# Patient Record
Sex: Female | Born: 1960
Health system: Southern US, Community
[De-identification: ages and names within clinical notes are randomized; demographics above are authoritative.]

## PROBLEM LIST (undated history)

## (undated) DIAGNOSIS — F329 Major depressive disorder, single episode, unspecified: Secondary | ICD-10-CM

## (undated) DIAGNOSIS — F17200 Nicotine dependence, unspecified, uncomplicated: Secondary | ICD-10-CM

## (undated) DIAGNOSIS — I10 Essential (primary) hypertension: Secondary | ICD-10-CM

## (undated) DIAGNOSIS — F32A Depression, unspecified: Secondary | ICD-10-CM

## (undated) HISTORY — DX: Depression, unspecified: F32.A

## (undated) HISTORY — DX: Nicotine dependence, unspecified, uncomplicated: F17.200

## (undated) HISTORY — DX: Essential (primary) hypertension: I10

## (undated) HISTORY — DX: Major depressive disorder, single episode, unspecified: F32.9

## (undated) HISTORY — PX: TUBAL LIGATION: SHX77

---

## 1998-01-21 ENCOUNTER — Other Ambulatory Visit: Admission: RE | Admit: 1998-01-21 | Discharge: 1998-01-21 | Payer: Self-pay | Admitting: Obstetrics and Gynecology

## 1998-04-05 HISTORY — PX: PELVIC LAPAROSCOPY: SHX162

## 1998-04-28 ENCOUNTER — Ambulatory Visit (HOSPITAL_COMMUNITY): Admission: RE | Admit: 1998-04-28 | Discharge: 1998-04-28 | Payer: Self-pay | Admitting: Obstetrics and Gynecology

## 1999-02-03 ENCOUNTER — Other Ambulatory Visit: Admission: RE | Admit: 1999-02-03 | Discharge: 1999-02-03 | Payer: Self-pay | Admitting: Gynecology

## 2000-09-13 ENCOUNTER — Other Ambulatory Visit: Admission: RE | Admit: 2000-09-13 | Discharge: 2000-09-13 | Payer: Self-pay | Admitting: Gynecology

## 2002-02-13 ENCOUNTER — Other Ambulatory Visit: Admission: RE | Admit: 2002-02-13 | Discharge: 2002-02-13 | Payer: Self-pay | Admitting: Gynecology

## 2003-05-01 ENCOUNTER — Other Ambulatory Visit: Admission: RE | Admit: 2003-05-01 | Discharge: 2003-05-01 | Payer: Self-pay | Admitting: Family Medicine

## 2004-06-18 ENCOUNTER — Other Ambulatory Visit: Admission: RE | Admit: 2004-06-18 | Discharge: 2004-06-18 | Payer: Self-pay | Admitting: Family Medicine

## 2005-11-18 ENCOUNTER — Ambulatory Visit: Payer: Self-pay | Admitting: Professional

## 2005-11-22 ENCOUNTER — Ambulatory Visit: Payer: Self-pay | Admitting: Professional

## 2006-04-05 HISTORY — PX: COLONOSCOPY: SHX174

## 2007-01-09 ENCOUNTER — Ambulatory Visit: Payer: Self-pay | Admitting: Gastroenterology

## 2007-01-23 ENCOUNTER — Ambulatory Visit: Payer: Self-pay | Admitting: Gastroenterology

## 2009-11-18 ENCOUNTER — Other Ambulatory Visit: Admission: RE | Admit: 2009-11-18 | Discharge: 2009-11-18 | Payer: Self-pay | Admitting: Gynecology

## 2009-11-18 ENCOUNTER — Ambulatory Visit: Payer: Self-pay | Admitting: Gynecology

## 2009-11-21 ENCOUNTER — Ambulatory Visit: Payer: Self-pay | Admitting: Gynecology

## 2010-02-24 ENCOUNTER — Encounter: Payer: Self-pay | Admitting: Family Medicine

## 2010-02-24 ENCOUNTER — Ambulatory Visit: Payer: Self-pay | Admitting: Family Medicine

## 2010-02-24 DIAGNOSIS — I10 Essential (primary) hypertension: Secondary | ICD-10-CM | POA: Insufficient documentation

## 2010-02-24 DIAGNOSIS — T7840XA Allergy, unspecified, initial encounter: Secondary | ICD-10-CM

## 2010-02-24 DIAGNOSIS — F17200 Nicotine dependence, unspecified, uncomplicated: Secondary | ICD-10-CM | POA: Insufficient documentation

## 2010-03-16 ENCOUNTER — Encounter: Payer: Self-pay | Admitting: Family Medicine

## 2010-03-16 ENCOUNTER — Ambulatory Visit: Payer: Self-pay | Admitting: Family Medicine

## 2010-03-16 LAB — CONVERTED CEMR LAB
CO2: 29 meq/L (ref 19–32)
Calcium: 8.7 mg/dL (ref 8.4–10.5)
Creatinine, Ser: 0.9 mg/dL (ref 0.4–1.2)
Glucose, Bld: 81 mg/dL (ref 70–99)
Sodium: 137 meq/L (ref 135–145)

## 2010-03-17 ENCOUNTER — Telehealth: Payer: Self-pay | Admitting: *Deleted

## 2010-05-07 NOTE — Assessment & Plan Note (Signed)
Summary: to be est/bp issues/njr ok per rachel/njr   Vital Signs:  Patient profile:   50 year old female Menstrual status:  irregular LMP:     01/04/2010 Height:      66 inches Weight:      186 pounds BMI:     30.13 Temp:     98.7 degrees F oral BP sitting:   132 / 98  (left arm) Cuff size:   regular  Vitals Entered By: Kern Reap CMA Duncan Dull) (February 24, 2010 3:29 PM) LMP (date): 01/04/2010     Menstrual Status irregular Enter LMP: 01/04/2010   History of Present Illness: Jacayla is a 50 year old, married female, who comes in today as a new patient for evaluation of hypertension.  Her GYN and Dr. Lily Peer, recently diagnosed her with hypertension, and start her on Benicar HCT 20 -- 12.5.  BP 132/98 here today which is consistent when she is getting at home.  She takes sertraline under milligrams daily for mild depression and has been on this for 4 years and wishes to continue.  She smokes 10 cigarettes a day for the past two years prior to that.  She had quit for 14 years.  We discussed the fact that we would need to stop smoking completely.  She concurs and will follow the program, which N. checks.  She's had a colonoscopy because her mother died of colon cancer.  Fortunately, her colonoscopy was normal.  Review of systems otherwise negative.  Except her last mammogram was 18 months ago advised to get yearly.  Tetanus booster August 2011 seasonal flu shot October 2011  Preventive Screening-Counseling & Management  Alcohol-Tobacco     Smoking Status: current     Packs/Day: 0.5  Caffeine-Diet-Exercise     Does Patient Exercise: yes  Hep-HIV-STD-Contraception     Dental Visit-last 6 months no      Drug Use:  no.    Allergies (verified): 1)  ! * Minocycoline  Past History:  Past medical, surgical, family and social histories (including risk factors) reviewed, and no changes noted (except as noted below).  Past Medical History: Allergic  rhinitis Hypertension  Past Surgical History: Caesarean section X 3  Family History: Reviewed history and no changes required. Father: alzheimer - deceased Mother: Parkinson's desease, hx of colon cancer Siblings: 2 brothers, 5 sisters Children: 3 - healthy  Social History: Reviewed history and no changes required. 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 No apparent alopecia or balding. Eyes:  No corneal or conjunctival inflammation noted. EOMI. Perrla. Funduscopic exam benign, without hemorrhages, exudates or papilledema. Vision grossly normal. Ears:  External ear exam shows no significant lesions or deformities.  Otoscopic examination reveals clear canals, tympanic membranes are intact bilaterally without bulging, retraction, inflammation or discharge. Hearing is grossly normal bilaterally. Nose:  External nasal examination shows no deformity or inflammation. Nasal mucosa are pink and moist without lesions or exudates. Mouth:  Oral mucosa and oropharynx without lesions or exudates.  Teeth in good repair. Neck:  No deformities, masses, or tenderness noted. Chest Wall:  No deformities, masses, or tenderness noted. Breasts:  No mass, nodules, thickening, tenderness,  bulging, retraction, inflamation, nipple discharge or skin changes noted.   Lungs:  Normal respiratory effort, chest expands symmetrically. Lungs are clear to auscultation, no crackles or wheezes. Heart:  Normal rate and regular rhythm. S1 and S2 normal without gallop, murmur, click, rub or other extra  sounds. Abdomen:  Bowel sounds positive,abdomen soft and non-tender without masses, organomegaly or hernias noted. Msk:  No deformity or scoliosis noted of thoracic or lumbar spine.   Pulses:  R and L carotid,radial,femoral,dorsalis pedis and posterior tibial pulses are full and equal bilaterally Neurologic:  No cranial nerve deficits noted. Station and gait are normal. Plantar reflexes are down-going bilaterally. DTRs are symmetrical throughout. Sensory, motor and coordinative functions appear intact. Skin:  Intact without suspicious lesions or rashes...................Marland Kitchenexcept for a birthmark on the inner side of her left posterior upper thigh Cervical Nodes:  No lymphadenopathy noted Axillary Nodes:  No palpable lymphadenopathy Inguinal Nodes:  No significant adenopathy Psych:  Cognition and judgment appear intact. Alert and cooperative with normal attention span and concentration. No apparent delusions, illusions, hallucinations   Impression & Recommendations:  Problem # 1:  HYPERTENSION (ICD-401.9) Assessment New  The following medications were removed from the medication list:    Benicar Hct 20-12.5 Mg Tabs (Olmesartan medoxomil-hctz) .Marland Kitchen... Take one tab by mouth once daily Her updated medication list for this problem includes:    Zestoretic 20-25 Mg Tabs (Lisinopril-hydrochlorothiazide) .Marland Kitchen... Take 1 tablet by mouth every morning  Orders: EKG w/ Interpretation (93000)  Complete Medication List: 1)  Sertraline Hcl 100 Mg Tabs (Sertraline hcl) .... Take one tab by mouth once daily 2)  Zestoretic 20-25 Mg Tabs (Lisinopril-hydrochlorothiazide) .... Take 1 tablet by mouth every morning 3)  Chantix Continuing Month Pak 1 Mg Tabs (Varenicline tartrate) .... Uad  Other Orders: Tobacco use cessation intermediate 3-10 minutes (16109)  Patient Instructions: 1)  discontinue the Benicar, and begin the Zestoretic one tablet daily in the morning.  Check your blood pressure daily in the morning  and return in two weeks for follow-up. 2)  Begin the chantix one half tablet daily. 3)  Walk 15 minutes daily and stay on a salt free diet 4)  Schedule your mammogram. 5)  Take calcium +Vitamin D daily. 6)  Take an Aspirin every day. Prescriptions: SERTRALINE HCL 100 MG TABS (SERTRALINE HCL) take one tab by mouth once daily  #100 x 3   Entered and Authorized by:   Roderick Pee MD   Signed by:   Roderick Pee MD on 02/24/2010   Method used:   Print then Give to Patient   RxID:   [id number] CHANTIX CONTINUING MONTH PAK 1 MG TABS (VARENICLINE TARTRATE) UAD  #1 x 2   Entered and Authorized by:   Roderick Pee MD   Signed by:   Roderick Pee MD on 02/24/2010   Method used:   Print then Give to Patient   RxID:   [id number] ZESTORETIC 20-25 MG TABS (LISINOPRIL-HYDROCHLOROTHIAZIDE) Take 1 tablet by mouth every morning  #30 x 1   Entered and Authorized by:   Roderick Pee MD   Signed by:   Roderick Pee MD on 02/24/2010   Method used:   Print then Give to Patient   RxID:   [id number]    Orders Added: 1)  EKG w/ Interpretation [93000] 2)  New Patient 40-64 years [99386] 3)  Tobacco use cessation intermediate 3-10 minutes [99406]

## 2010-05-07 NOTE — Progress Notes (Signed)
  Phone Note Outgoing Call   Call placed by: Kathlene November LPN,  March 17, 2010 2:01 PM Call placed to: Patient Summary of Call: [facility] for pt to return call for results and instructions. Initial call taken by: Kathlene November LPN,  March 17, 2010 2:01 PM  Follow-up for Phone Call        Pt notified of resultts and MD instructions Follow-up by: Kathlene November LPN,  March 17, 2010 3:23 PM    Additional Follow-up for Phone Call Additional follow up Details #2::    please call.......... labs normal except potassium slightly low at 3.4,,,,,,,,,,, range of normal 3.5 to 5.5,,,,,,,,, take one over-the-counter potassium supplement daily Follow-up by: Roderick Pee MD,  March 17, 2010 1:33 PM

## 2010-05-07 NOTE — Assessment & Plan Note (Signed)
Summary: 2 week fup//ccm/pt rsc/cjr   Vital Signs:  Patient profile:   50 year old female Menstrual status:  irregular Height:      66 inches Weight:      180.5 pounds Temp:     97.9 degrees F oral Pulse rate:   85 / minute BP sitting:   136 / 86  (left arm) Cuff size:   regular  Vitals Entered By: Kathlene November LPN (March 16, 2010 3:21 PM) CC: recheck BP   CC:  recheck BP.  History of Present Illness: Alexandria Cruz is a 50 year old female, who comes in today for a general physical examination because of a history of underlying tobacco abuse, hypertension, sleep dysfunction, and depression.  For hypertension.  She takes Zestoretic 20 to 25 daily.  BP 130/86.  She takes 100 mg of Zoloft at bedtime, along with a Ambien for sleep.  She is also on chantix a half a tablet a day and has cut her cigarette consumption down to 10 a day.  We discussed various options to go to zero.  Paps are done by GYN.  She has routine eye care, dental care, B SE monthly, annual mammography, colonoscopy at age 62, because her mother had colon cancer.  Tetanus 2,011 seasonal flu shot 2011    She used routine eye care, dental care, BSE monthly, annual mammography, colonoscopy at age 75, normal because her mother had colon cancer.  Tetanus 52,011 seasonal flu shot 2011 Current Medications (verified): 1)  Sertraline Hcl 100 Mg Tabs (Sertraline Hcl) .... Take One Tab By Mouth Once Daily 2)  Zestoretic 20-25 Mg Tabs (Lisinopril-Hydrochlorothiazide) .... Take 1 Tablet By Mouth Every Morning 3)  Chantix Continuing Month Pak 1 Mg Tabs (Varenicline Tartrate) .... Uad 4)  Ambien 10 Mg Tabs (Zolpidem Tartrate) .... Take One Tablet By Mouth At Bedtime For Sleep  Allergies (verified): 1)  ! * Minocycoline  Comments:  Nurse/Medical Assistant: The patient's medications and allergies were reviewed with the patient and were updated in the Medication and Allergy Lists. Kathlene November LPN (March 16, 2010 3:22  PM)  Past History:  Past medical, surgical, family and social histories (including risk factors) reviewed, and no changes noted (except as noted below).  Past Medical History: Reviewed history from 02/24/2010 and no changes required. Allergic rhinitis Hypertension  Past Surgical History: Reviewed history from 02/24/2010 and no changes required. Caesarean section X 3  Family History: Reviewed history from 02/24/2010 and no changes required. Father: alzheimer - deceased Mother: Parkinson's desease, hx of colon cancer Siblings: 2 brothers, 5 sisters Children: 3 - healthy  Social History: Reviewed history from 02/24/2010 and no changes required. 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 No apparent alopecia or balding. Eyes:  No corneal or conjunctival inflammation noted. EOMI. Perrla. Funduscopic exam benign, without hemorrhages, exudates or papilledema. Vision grossly normal. Ears:  External ear exam shows no significant lesions or deformities.  Otoscopic examination reveals clear canals, tympanic membranes are intact bilaterally without bulging, retraction, inflammation or discharge. Hearing is grossly normal bilaterally. Nose:  External nasal examination shows no deformity or inflammation. Nasal mucosa are pink and moist without lesions or exudates. Mouth:  Oral mucosa and oropharynx without lesions or exudates.  Teeth in good repair. Neck:  No deformities, masses, or  tenderness noted. Chest Wall:  No deformities, masses, or tenderness noted. Breasts:  No mass, nodules, thickening, tenderness, bulging, retraction, inflamation, nipple discharge or skin changes noted.   Lungs:   Normal respiratory effort, chest expands symmetrically. Lungs are clear to auscultation, no crackles or wheezes. Heart:  Normal rate and regular rhythm. S1 and S2 normal without gallop, murmur, click, rub or other extra sounds. Abdomen:  Bowel sounds positive,abdomen soft and non-tender without masses, organomegaly or hernias noted. Msk:  No deformity or scoliosis noted of thoracic or lumbar spine.   Pulses:  R and L carotid,radial,femoral,dorsalis pedis and posterior tibial pulses are full and equal bilaterally Extremities:  No clubbing, cyanosis, edema, or deformity noted with normal full range of motion of all joints.   Neurologic:  No cranial nerve deficits noted. Station and gait are normal. Plantar reflexes are down-going bilaterally. DTRs are symmetrical throughout. Sensory, motor and coordinative functions appear intact. Skin:  Intact without suspicious lesions or rashes Cervical Nodes:  No lymphadenopathy noted Axillary Nodes:  No palpable lymphadenopathy Inguinal Nodes:  No significant adenopathy Psych:  Cognition and judgment appear intact. Alert and cooperative with normal attention span and concentration. No apparent delusions, illusions, hallucinations   Impression & Recommendations:  Problem # 1:  TOBACCO ABUSE (ICD-305.1) Assessment Improved  Her updated medication list for this problem includes:    Chantix Continuing Month Pak 1 Mg Tabs (Varenicline tartrate) ..... Uad  Orders: Venipuncture (16109) TLB-BMP (Basic Metabolic Panel-BMET) (80048-METABOL) Prescription Created Electronically [id number]) Specimen Handling (09811)  Problem # 2:  ROUTINE GENERAL MEDICAL [email or web address]  CARE FACL (ICD-V70.0) Assessment: Unchanged  Orders: Venipuncture (91478) TLB-BMP (Basic Metabolic Panel-BMET) (80048-METABOL) Prescription Created Electronically [id number]) Specimen Handling (13086)  Problem # 3:  HYPERTENSION (ICD-401.9) Assessment: Improved  Her updated medication list for this  problem includes:    Zestoretic 20-25 Mg Tabs (Lisinopril-hydrochlorothiazide) .Marland Kitchen... Take 1 tablet by mouth every morning  Orders: Venipuncture (57846) TLB-BMP (Basic Metabolic Panel-BMET) (80048-METABOL) Prescription Created Electronically [id number]) Specimen Handling (28413)  Complete Medication List: 1)  Sertraline Hcl 100 Mg Tabs (Sertraline hcl) .... Take one tab by mouth once daily 2)  Zestoretic 20-25 Mg Tabs (Lisinopril-hydrochlorothiazide) .... Take 1 tablet by mouth every morning 3)  Chantix Continuing Month Pak 1 Mg Tabs (Varenicline tartrate) .... Uad 4)  Ambien 10 Mg Tabs (Zolpidem tartrate) .... Take one tablet by mouth at bedtime for sleep  Patient Instructions: 1)  begin to taper these cigarettes by two per week and set a quit date. 2)  Tried to increase the chantix to one half tab twice daily to see if this might help more than a half a tablet a day or just stay with a half a tablet a day. 3)  Please schedule a follow-up appointment in 1 year. 4)  It is important that you exercise regularly at least 20 minutes 5 times a week. If you develop chest pain, have severe difficulty breathing, or feel very tired , stop exercising immediately and seek medical attention. 5)  Schedule your mammogram. 6)  Schedule a colonoscopy/sigmoidoscopy to help detect colon cancer. 7)  Take calcium +Vitamin D daily. 8)  Take an Aspirin every day. Prescriptions: AMBIEN 10 MG TABS (ZOLPIDEM TARTRATE) Take one tablet by mouth at bedtime for sleep  #100 x 3   Entered and Authorized by:   Roderick Pee MD   Signed by:   Roderick Pee MD on 03/16/2010   Method used:   Print then Give to Patient   RxID:  [id number] ZESTORETIC 20-25 MG TABS (LISINOPRIL-HYDROCHLOROTHIAZIDE) Take 1 tablet by mouth every morning  #100 x 3   Entered and Authorized by:   Roderick Pee MD   Signed by:   Roderick Pee MD on 03/16/2010   Method used:   Print then Give to Patient   RxID:    [id number]    Orders Added: 1)  Venipuncture [25366] 2)  TLB-BMP (Basic Metabolic Panel-BMET) [80048-METABOL] 3)  Prescription Created Electronically [[id number]] 4)  Est. Patient 40-64 years [99396] 5)  Specimen Handling [99000]   Immunization History:  Tetanus/Td Immunization History:    Tetanus/Td:  historical (11/03/2009)  Influenza Immunization History:    Influenza:  historical (01/03/2010)   Immunization History:  Tetanus/Td Immunization History:    Tetanus/Td:  Historical (11/03/2009)  Influenza Immunization History:    Influenza:  Historical (01/03/2010)

## 2010-07-06 ENCOUNTER — Other Ambulatory Visit: Payer: Self-pay | Admitting: *Deleted

## 2010-07-06 MED ORDER — ZOLPIDEM TARTRATE 10 MG PO TABS: 10.0000 mg | ORAL_TABLET | Freq: Every evening | ORAL | Status: DC | PRN

## 2010-12-01 ENCOUNTER — Other Ambulatory Visit: Payer: Self-pay | Admitting: *Deleted

## 2010-12-01 MED ORDER — ZOLPIDEM TARTRATE 10 MG PO TABS: 10.0000 mg | ORAL_TABLET | Freq: Every evening | ORAL | Status: DC | PRN

## 2011-02-04 ENCOUNTER — Other Ambulatory Visit (HOSPITAL_COMMUNITY)
Admission: RE | Admit: 2011-02-04 | Discharge: 2011-02-04 | Disposition: A | Discharge status: Home / Self Care | Payer: BC Managed Care – PPO | Source: Ambulatory Visit | Attending: Gynecology | Admitting: Gynecology

## 2011-02-04 ENCOUNTER — Encounter: Payer: Self-pay | Admitting: Gynecology

## 2011-02-04 ENCOUNTER — Ambulatory Visit (INDEPENDENT_AMBULATORY_CARE_PROVIDER_SITE_OTHER): Payer: BC Managed Care – PPO | Admitting: Gynecology

## 2011-02-04 DIAGNOSIS — N951 Menopausal and female climacteric states: Secondary | ICD-10-CM | POA: Insufficient documentation

## 2011-02-04 DIAGNOSIS — Z01419 Encounter for gynecological examination (general) (routine) without abnormal findings: Secondary | ICD-10-CM | POA: Insufficient documentation

## 2011-02-04 DIAGNOSIS — I10 Essential (primary) hypertension: Secondary | ICD-10-CM

## 2011-02-04 DIAGNOSIS — R635 Abnormal weight gain: Secondary | ICD-10-CM

## 2011-02-04 DIAGNOSIS — Z1211 Encounter for screening for malignant neoplasm of colon: Secondary | ICD-10-CM

## 2011-02-04 LAB — COMPREHENSIVE METABOLIC PANEL
AST: 20 U/L (ref 0–37)
Albumin: 4.2 g/dL (ref 3.5–5.2)
Alkaline Phosphatase: 99 U/L (ref 39–117)
Calcium: 9.4 mg/dL (ref 8.4–10.5)
Chloride: 109 mEq/L (ref 96–112)
Potassium: 4.6 mEq/L (ref 3.5–5.3)
Sodium: 143 mEq/L (ref 135–145)
Total Protein: 6.4 g/dL (ref 6.0–8.3)

## 2011-02-04 NOTE — Progress Notes (Signed)
Alexandria Cruz 03-18-1961 [id number]   History:    50 y.o.  for annual exam complaining of vasomotor symptoms such as hot flashes irritability and mood swing vaginal dryness and decreased libido. She stated her last menstrual period was in June of this year. She is being followed by her internist for her hypertension and has not taken her blood pressure medicine today. Review of her records indicated she was weighing 184 last year and 180 now. Her blood pressure today was 156/94. Patient is smoking half a pack cigarette per day and her primary physician's been working with her and encouraged her to take chantix. Review records indicated her last mammogram was in January 2012. She is doing her monthly self breast examination. Her mother has had history of colon cancer the patient herself had a colonoscopy 5 years ago. She's also had a previous tubal ligation procedure.  Past medical history,surgical history, family history and social history were all reviewed and documented in the EPIC chart.  ROS:  Was performed and pertinent positives and negatives are included in the history.  Exam: chaperone present BP 156/94  Ht 5' 6.5" (1.689 m)  Wt 180 lb (81.647 kg)  BMI 28.62 kg/m2  LMP 10/28/2010  Body mass index is 28.62 kg/(m^2).  General appearance : Well developed well nourished female. No acute distress HEENT: Neck supple, trachea midline, no carotid bruits, no thyroidmegaly Lungs: Clear to auscultation, no rhonchi or wheezes, or rib retractions  Heart: Regular rate and rhythm, no murmurs or gallops Breast:Examined in sitting and supine position were symmetrical in appearance, no palpable masses or tenderness,  no skin retraction, no nipple inversion, no nipple discharge, no skin discoloration, no axillary or supraclavicular lymphadenopathy Abdomen: no palpable masses or tenderness, no rebound or guarding Extremities: no edema or skin discoloration or tenderness  Pelvic:  Bartholin,  Urethra, Skene Glands: Within normal limits             Vagina: No gross lesions or discharge  Cervix: No gross lesions or discharge  Uterus  anteverted, normal size, shape and consistency, non-tender and mobile  Adnexa  Without masses or tenderness  Anus and perineum  normal   Rectovaginal  normal sphincter tone without palpated masses or tenderness             Hemoccult done resulting in time of this dictation     Assessment/Plan:  50 y.o. female for annual exam with signs of systems consisting of the menopause. We'll confirm with an [facility] today and literature formation on the menopause as well as on hormone replacement therapy will be provided and patient will return back next week to plan a course of management and discuss her lab results. Since she is hypertensive and has not seen her internist and posterior year will proceed with doing her comprehensive metabolic panel along with her CBC fasting lipid profile urinalysis and Pap smear. She was encouraged to contact her gastroenterologist to schedule colonoscopy. For fecal occult blood testing was done today resulting in time of this dictation. She was encouraged to continue monthly self breast examination. And we discussed importance of calcium and vitamin D for osteoporosis prevention as well. Next year will do a baseline bone density study as well.    Ok Edwards MD, 10:27 AM 02/04/2011

## 2011-02-04 NOTE — Patient Instructions (Signed)
Patient information: Menopause (Beyond the Basics)  Author Cherie Ouch, MD Section Editors Lysbeth Galas, MD Marcelle Smiling, MD Deputy Editor Mila Merry, MD Disclosures  All topics are updated as new evidence becomes available and our peer review process is complete.  Literature review current through: Sep 2012.  This topic last updated: Nov 20, 2010.  INTRODUCTION - Menopause is defined as the time in a woman's life, usually between 45 and 55 years, when the ovaries stop producing eggs and menstrual periods end. The average age of menopause is 51 years. Years before you stop having menstrual periods, changes in your hormone levels can lead to some of the symptoms of menopause. In addition to irregular periods, the most common symptoms are hot flashes, night sweats, sleep problems, and vaginal dryness. Menopause is a normal part of a woman's life and does not always need to be treated. However, the changes that happen before and after menopause can be disruptive. If you have bothersome symptoms, effective treatments are available. More detailed information about menopause is available by subscription. (See "Clinical manifestations and diagnosis of menopause".) AM I GOING THROUGH MENOPAUSE? - A number of terms are used to describe the time before and after you stop having menstrual periods. The menopausal transition starts when your menstrual periods first begin to change (become more or less frequent, more or less bleeding, skipped periods), and ends when you have your final menstrual period. Many people refer to this transition as "perimenopause."  Menopause occurs when it has been 12 months since your last menstrual period.  Postmenopause is the time after menopause. The average age of menopause is 58 years, although the age range can vary between 51 and 55 years. Women who become menopausal before age 69 are considered to have an abnormally early menopause (called  premature ovarian failure or primary ovarian insufficiency). (See "Patient information: Early menopause (primary ovarian insufficiency) (Beyond the Basics)".) If you are 45 years or older and you have not had a menstrual period in 12 months, there is a good chance that you are menopausal. Most women in this group do not need any lab testing to confirm menopause, especially if they are having menopausal symptoms such as hot flashes or vaginal dryness. If you are less than 82 years old and you stop having periods or if you have questions about menopausal symptoms, talk to your doctor or nurse. You may need further testing to see if menopause, or another problem, is the cause of your symptoms. After hysterectomy - If you do not have a uterus (eg, after hysterectomy) but you still have ovaries, it can be hard to know when you are menopausal because you will not have menstrual periods. You may develop menopausal symptoms as your ovaries stop working and your blood levels of estrogen begin to fall. If you are having bothersome symptoms of menopause after hysterectomy, talk to your doctor or nurse. MENOPAUSE AND MENSTRUAL PERIODS - Many women begin to notice changes in their menstrual periods during the menopausal transition (perimenopause). These changes may include: Having menstrual periods more or less often than usual (eg, every five to six weeks instead of every four)  Having bleeding that lasts for fewer days than before  Skipping one or more menstrual periods  Having symptoms of menopause (see 'Menopause symptoms' below) Abnormal bleeding - It can be hard to know if vaginal bleeding is abnormal when you are near menopause. In general, you should see your doctor or nurse if  you have the following symptoms: Vaginal bleeding more often than every three weeks  Excessive, heavy menstrual bleeding  Spotting between your periods  Vaginal bleeding after menopause (even if it's just a spot of blood) Irregular  vaginal bleeding may be a normal part of menopause or it may be a sign of a problem. (See "Patient information: Absent or irregular periods (Beyond the Basics)".) Menopause and birth control - Although most women are less likely to become pregnant (without infertility treatment) after age 13, it is still possible, especially if you are having monthly periods and having sex regularly. If you do not want to become pregnant, you should continue to use some form of birth control until you are menopausal. Once you become menopausal, you cannot get pregnant. If you are using a hormonal method of birth control, like pills, an injection, a vaginal ring, or a skin patch, talk to your doctor or nurse to find out when you should stop. (See "Patient information: Hormonal methods of birth control (Beyond the Basics)".)  Alternatively, you can switch to a non-hormonal method of birth control (condoms, spermicide) sooner. If you are over 45, using a non-hormonal method of birth control, and you have not had a menstrual period for 12 months, you can stop using birth control altogether.  If you are using an IUD, you can have it removed when it expires (after 10 years for most copper IUDs, after five years for levonorgestrel IUDs [eg, Mirena]). You can also ask to have the IUD removed sooner. MENOPAUSE SYMPTOMS - As the ovaries stop working, levels of estrogen fall, leading to the typical symptoms of menopause. Some women have few or no menopausal symptoms while other women have bothersome symptoms that interfere with their life. These symptoms often begin during the menopause transition, before you stop having periods. The most common symptoms of menopause include: Hot flashes - Hot flashes typically begin as a sudden feeling of heat in the upper chest and face. The hot feeling then spreads throughout the body and lasts for two to four minutes. Some women sweat during the hot flash and then feel chills and shiver when the hot  flash ends. Some women have a feeling of anxiety or heart palpitations during the hot flash. Hot flashes can occur once or twice each day or as often as once per hour during the day and night.  Hot flashes usually begin well before your last menstrual period. It is not clear what causes hot flashes. Most women who have hot flashes will continue to have them for about four years (on average). (See "Menopausal hot flashes".)  Night sweats - When hot flashes happen during sleep, they are called night sweats. Night sweats may cause you to sweat through your clothes and wake you from sleep because you are hot or cold. This can happen one or more times per night. Waking frequently can make it hard to get a good night's sleep. As a result of interrupted sleep, many women develop other problems, such as fatigue, irritability, trouble concentrating, and mood swings.  Sleep problems - During the transition to menopause, some women begin to have trouble falling asleep or staying asleep, even if night sweats are not a problem. Sleep problems can cause you to feel tired and irritable the next day. Effective treatments for sleep problems are available. (See "Patient information: Insomnia treatments (Beyond the Basics)".)  Vaginal dryness - As the levels of estrogen in the blood fall before and during menopause, the tissues inside  the vagina and urethra (the tube from the bladder to the outside of the body) can become thin and dry. This can cause you to have vaginal dryness or irritation or to have pain or dryness with sex. (See "Patient information: Vaginal dryness (Beyond the Basics)".)  Depression - During the menopausal transition, some women develop new problems with mood, such as sadness, difficulty concentrating, feeling uninterested in normal activities, and sleeping too much or having trouble staying asleep. Women with a past history of depression can feel more blue moods during the menopause transition. If you have  any symptoms of depression or blues that will not go away, talk to your doctor or nurse. There are a number of effective treatments available. (See "Patient information: Depression treatment options for adults (Beyond the Basics)".) MENOPAUSE TREATMENT - Not all women will need treatment for menopausal symptoms, especially if the symptoms are mild. In fact, there are things you can do on your own that might help you cope with the symptoms you have (table 1). For women whose symptoms are really bothersome, there are several treatment options: Postmenopausal hormone therapy - Women with bothersome hot flashes can usually get relief with postmenopausal hormone therapy. For women with a uterus, this would be a combination of estrogen and a progesterone-like medication. Women who do not have a uterus (eg, after a hysterectomy) need only estrogen. Hormone therapy is available in a pill that you take by mouth; a skin patch; a vaginal ring; and a skin gel, cream, or spray. (See "Patient information: Postmenopausal hormone therapy (Beyond the Basics)".)  Hormone therapy alternatives - If you are bothered by hot flashes but you cannot take or would prefer to avoid hormone therapy, there are alternatives. Although hormone therapy is the most effective treatment for hot flashes, non-hormonal alternatives are a good option for many women. (See "Patient information: Nonhormonal treatments for menopausal symptoms (Beyond the Basics)".)

## 2011-02-10 ENCOUNTER — Institutional Professional Consult (permissible substitution): Payer: BC Managed Care – PPO | Admitting: Gynecology

## 2011-02-17 ENCOUNTER — Encounter: Payer: Self-pay | Admitting: Gynecology

## 2011-02-17 ENCOUNTER — Ambulatory Visit (INDEPENDENT_AMBULATORY_CARE_PROVIDER_SITE_OTHER): Payer: BC Managed Care – PPO | Admitting: Gynecology

## 2011-02-17 VITALS — BP 150/98

## 2011-02-17 DIAGNOSIS — E781 Pure hyperglyceridemia: Secondary | ICD-10-CM

## 2011-02-17 DIAGNOSIS — E782 Mixed hyperlipidemia: Secondary | ICD-10-CM | POA: Insufficient documentation

## 2011-02-17 DIAGNOSIS — N951 Menopausal and female climacteric states: Secondary | ICD-10-CM

## 2011-02-17 DIAGNOSIS — Z780 Asymptomatic menopausal state: Secondary | ICD-10-CM

## 2011-02-17 DIAGNOSIS — Z79890 Hormone replacement therapy (postmenopausal): Secondary | ICD-10-CM

## 2011-02-17 MED ORDER — PROGESTERONE MICRONIZED 200 MG PO CAPS: 200.0000 mg | ORAL_CAPSULE | Freq: Every day | ORAL | Status: DC

## 2011-02-17 MED ORDER — ESTRADIOL 0.52 MG/0.87 GM (0.06%) TD GEL: 1.0000 "application " | Freq: Every day | TRANSDERMAL | Status: DC

## 2011-02-17 NOTE — Progress Notes (Signed)
Patient 50 year old gravida 3 para 3 that was seen in the office on November 1 for her annual gynecological examination. Her main concern has been her vasomotor symptoms which has worsened recently consisting of hot flashes, irritability, mood swing, vaginal dryness and decreased libido. She stated she has not had a menstrual period closed in 6 months. She does have a history of prior tubal ligation. See previous encounter outlining her physical examination.  The following labs were drawn competence metabolic panel, CBC, urinalysis, Pap smear, and TSH which all were normal her fasting lipid profile demonstrated her triglycerides slightly elevated at 179. And her FSH was elevated at 78. Fecal occult blood testing was negative. She scheduled for mammogram in January. And screening colonoscopy had been recommended as well.  We had a an extensive discussion today on the menopause and hormone replacement therapy. Patient previously has been provided with literature information on the subject. We also discussed the women's health initiative study in relationship to home replacement therapy and risk of breast cancer. We also discussed different hormonal routes to include oral to transdermal application and intravaginal rings. Because of her elevated triglycerides I would recommend that we go on a transdermal route to bypass the liver. We discussed her risk of deep venous thrombosis as a result of her smoking, being on hormonal replacement therapy. Dr. Tawanna Cooler has been working with this with her for quite some time and has even offered her and prescribed her Chantix.  Patient fully understands and accepts all the above mentioned risk. She will be placed on elestrin 0.06% transdermal to apply to one arm only daily with the addition of progestational agent (Prometrium) 200 mg for 12 days of the month only. She was encouraged to continue her monthly breast exams and to followup with her mammogram in January. Any irregular  form of bleeding should be reported to her office whereby she would need an endometrial sampling.

## 2011-04-27 ENCOUNTER — Encounter: Payer: Self-pay | Admitting: Gynecology

## 2011-05-04 ENCOUNTER — Other Ambulatory Visit: Payer: Self-pay | Admitting: Family Medicine

## 2011-11-24 ENCOUNTER — Encounter: Payer: Self-pay | Admitting: Gastroenterology

## 2011-12-21 ENCOUNTER — Encounter: Payer: Self-pay | Admitting: Gynecology

## 2011-12-21 ENCOUNTER — Ambulatory Visit (INDEPENDENT_AMBULATORY_CARE_PROVIDER_SITE_OTHER): Payer: BC Managed Care – PPO | Admitting: Gynecology

## 2011-12-21 VITALS — BP 122/78

## 2011-12-21 DIAGNOSIS — R350 Frequency of micturition: Secondary | ICD-10-CM

## 2011-12-21 DIAGNOSIS — N390 Urinary tract infection, site not specified: Secondary | ICD-10-CM

## 2011-12-21 DIAGNOSIS — R300 Dysuria: Secondary | ICD-10-CM

## 2011-12-21 LAB — URINALYSIS W MICROSCOPIC + REFLEX CULTURE
Bilirubin Urine: NEGATIVE
Ketones, ur: NEGATIVE mg/dL
Nitrite: POSITIVE — AB
Protein, ur: >300 mg/dL — AB
Urobilinogen, UA: 0.2 mg/dL (ref 0.0–1.0)

## 2011-12-21 MED ORDER — NITROFURANTOIN MONOHYD MACRO 100 MG PO CAPS: 100.0000 mg | ORAL_CAPSULE | Freq: Two times a day (BID) | ORAL | Status: DC

## 2011-12-21 NOTE — Progress Notes (Signed)
Patient presented to the office today complaining the past few days the frequency and dysuria and completely empty her bladder. She had some chills but no temperature reported per se and no nausea or vomiting. Patient's husband has had a vasectomy. She reached the menopause last year has done well on the elestrin 0.06% which she applies transdermally to one arm daily with the addition of Prometrium 200 mg for 12 days of the month.  Exam: Back: No CVA tenderness Abdomen: Soft slight suprapubic tenderness Pelvic: Bartholin urethra Skene was within normal limits Cervix: No lesions or discharge Uterus: Anteverted normal size shape and consistency slightly tender suprapubic area Adnexa: No palpable masses or tenderness Rectal: Not examined  Urinalysis 21-50 WBC, 21-50 rbc, many bacteria  Assessment/plan: Urinary tract infection. Patient will be placed on Macrobid one by mouth twice a day for 7 days. She was given sample of Uribell anti-spasmodic agent to take 1 tablet 4 times a day for 2 days. She was instructed to increase her fluid intake. If she develops any CVA tenderness or fever she'll report to the office or after hours to the emergency department.

## 2011-12-21 NOTE — Patient Instructions (Signed)

## 2011-12-24 LAB — URINE CULTURE: Colony Count: >=100000

## 2012-02-21 ENCOUNTER — Telehealth: Payer: Self-pay | Admitting: Internal Medicine

## 2012-04-27 ENCOUNTER — Encounter: Payer: Self-pay | Admitting: Gynecology

## 2012-05-02 ENCOUNTER — Encounter: Payer: Self-pay | Admitting: Family Medicine

## 2012-05-02 ENCOUNTER — Ambulatory Visit (INDEPENDENT_AMBULATORY_CARE_PROVIDER_SITE_OTHER): Payer: BC Managed Care – PPO | Admitting: Gynecology

## 2012-05-02 ENCOUNTER — Encounter: Payer: Self-pay | Admitting: Gynecology

## 2012-05-02 ENCOUNTER — Ambulatory Visit (INDEPENDENT_AMBULATORY_CARE_PROVIDER_SITE_OTHER): Payer: BC Managed Care – PPO | Admitting: Family Medicine

## 2012-05-02 VITALS — BP 166/104 | Ht 65.25 in | Wt 195.0 lb

## 2012-05-02 VITALS — BP 230/140 | Temp 98.4°F | Wt 194.0 lb

## 2012-05-02 DIAGNOSIS — N951 Menopausal and female climacteric states: Secondary | ICD-10-CM

## 2012-05-02 DIAGNOSIS — I10 Essential (primary) hypertension: Secondary | ICD-10-CM

## 2012-05-02 DIAGNOSIS — Z01419 Encounter for gynecological examination (general) (routine) without abnormal findings: Secondary | ICD-10-CM

## 2012-05-02 DIAGNOSIS — F17200 Nicotine dependence, unspecified, uncomplicated: Secondary | ICD-10-CM

## 2012-05-02 DIAGNOSIS — Z87891 Personal history of nicotine dependence: Secondary | ICD-10-CM

## 2012-05-02 DIAGNOSIS — Z23 Encounter for immunization: Secondary | ICD-10-CM

## 2012-05-02 LAB — CBC WITH DIFFERENTIAL/PLATELET
Basophils Absolute: 0.1 10*3/uL (ref 0.0–0.1)
HCT: 45.5 % (ref 36.0–46.0)
Lymphocytes Relative: 37 % (ref 12–46)
Monocytes Absolute: 0.5 10*3/uL (ref 0.1–1.0)
Neutro Abs: 3.7 10*3/uL (ref 1.7–7.7)
Neutrophils Relative %: 49 % (ref 43–77)
RDW: 13.2 % (ref 11.5–15.5)
WBC: 7.3 10*3/uL (ref 4.0–10.5)

## 2012-05-02 LAB — COMPREHENSIVE METABOLIC PANEL
ALT: 15 U/L (ref 0–35)
Albumin: 4.4 g/dL (ref 3.5–5.2)
CO2: 29 mEq/L (ref 19–32)
Chloride: 105 mEq/L (ref 96–112)
Glucose, Bld: 85 mg/dL (ref 70–99)
Potassium: 4.3 mEq/L (ref 3.5–5.3)
Sodium: 139 mEq/L (ref 135–145)
Total Protein: 6.6 g/dL (ref 6.0–8.3)

## 2012-05-02 LAB — TSH: TSH: 1.729 u[IU]/mL (ref 0.350–4.500)

## 2012-05-02 LAB — CHOLESTEROL, TOTAL: Cholesterol: 228 mg/dL — ABNORMAL HIGH (ref 0–200)

## 2012-05-02 MED ORDER — VARENICLINE TARTRATE 1 MG PO TABS: ORAL_TABLET | ORAL | Status: DC

## 2012-05-02 MED ORDER — LOSARTAN POTASSIUM-HCTZ 100-25 MG PO TABS: 1.0000 | ORAL_TABLET | Freq: Every day | ORAL | Status: DC

## 2012-05-02 NOTE — Patient Instructions (Signed)

## 2012-05-02 NOTE — Progress Notes (Signed)
  Subjective:    Patient ID: Genia Harold, female    DOB: 14-Sep-1960, 52 y.o.   MRN: [id number]  HPI Mishaal is a 52 year old female smoker,,,,,,,, but she's down to 4 cigarettes a day,,,,,,,, who comes in today on referral from her gynecologist Dr. Lily Peer for treatment of hypertension  We initially saw her and placed her on Benicar 20-12.5. She stopped her blood pressure medication a year ago because her blood pressure was normal. She's been monitoring her blood pressure monthly at home and it's been in the 140/80 range. Today she went for a GYN evaluation and her blood pressure was 190/100. Dr. Lily Peer therefore referred her here.  She is down to 4 cigarettes a day and would like to quit smoking completely  She's off hormones. She'll I. takes Zoloft 200 mg daily  Social history she recently started a job at [employer]  Physical labs drawn by Dr. Lily Peer therefore not repeated   Review of Systems Review of systems negative except she's also drinking a lot of caffeine 4-6 caffeinated beverages daily. Salt consumption normal [place] salt consumption    Objective:   Physical Exam Well-developed well-nourished female no acute distress cardiopulmonary exam normal no carotid bruits aorta normal no renal bruits  BP when she came in was 230/140 right arm sitting position  She was immediately given clonidine 0.4 mg and 10 mg of a beta blocker Po.  BP was monitored every 15 minutes       Assessment & Plan:  Hypertension restart Hyzaar one tablet daily,,,,,,,,,,, complete salt free diet,,,,,,,, one cup of caffeinated beverages daily,,,,,,,,, bed rest at home,,,,,,,,,, BP checked 4 times daily,,,,,,,,, return on Thursday for followup............ do not work until cleared by [place]

## 2012-05-02 NOTE — Patient Instructions (Signed)
Hyzaar 100-25,,,,,,,,,,,,,, take one tablet now then starting tomorrow morning one tablet every morning  Bed rest at home  Check your blood pressure 4 times daily  Chantix one half tab every morning  No smoking  Complete salt free diet  Return on Thursday for followup with a record of all your blood pressure readings and the device. Purchase a digital blood pressure cuff

## 2012-05-03 ENCOUNTER — Encounter: Payer: Self-pay | Admitting: Gynecology

## 2012-05-03 ENCOUNTER — Other Ambulatory Visit: Payer: Self-pay | Admitting: *Deleted

## 2012-05-03 DIAGNOSIS — E7800 Pure hypercholesterolemia, unspecified: Secondary | ICD-10-CM

## 2012-05-03 LAB — URINALYSIS W MICROSCOPIC + REFLEX CULTURE
Hgb urine dipstick: NEGATIVE
Leukocytes, UA: NEGATIVE
Nitrite: NEGATIVE
Protein, ur: NEGATIVE mg/dL
Urobilinogen, UA: 0.2 mg/dL (ref 0.0–1.0)
pH: 6 (ref 5.0–8.0)

## 2012-05-03 LAB — HEMOGLOBIN A1C: Mean Plasma Glucose: 108 mg/dL (ref <117)

## 2012-05-03 NOTE — Progress Notes (Signed)
Alexandria Cruz [birth year] [id number]   History:    52 y.o.  for annual gyn exam with no complaints today. It was noted when she came in today that her blood pressure was 162/100 and after resting for 20 minutes her blood pressure was rechecked and was found to be 166/104. Her primary physician is Dr. Kelle Darting who currently has her on Hyzarr for her hypertension. She denied any headaches or any visual disturbances. She was started on hormone replacement last year but patient discontinued it and stated she is totally asymptomatic. She does continue to smoke 3-4 cigarettes per day and has been on Chantix in the past. She complained of increased weight. She is taking her calcium and vitamin D. She's had a previous tubal sterilization procedure. Her last Pap smear was normal in 2012. Patient's mother had history of colon cancer and patient's last colonoscopy was negative in 2007. She is on Zoloft 100 mg daily for depression. Her flu vaccine in Tdap vaccine are up-to-date as well. Her last mammogram was normal in January 2014. The mammogram was described that her breasts were dense otherwise normal. She states she does her monthly self breast examination.  Past medical history,surgical history, family history and social history were all reviewed and documented in the EPIC chart.  Gynecologic History Patient's last menstrual period was 10/28/2010. Contraception: post menopausal status Last Pap: 2012. Results were: normal Last mammogram: 2014. Results were: normal  Obstetric History OB History    Grav Para Term Preterm Abortions TAB SAB Ect Mult Living   3 3 3       3      # Outc Date GA Lbr Len/2nd Wgt Sex Del Anes PTL Lv   1 TRM     F CS   Yes   2 TRM     F CS   Yes   3 TRM     F CS   Yes       ROS: A ROS was performed and pertinent positives and negatives are included in the history.  GENERAL: No fevers or chills. HEENT: No change in vision, no earache, sore throat or sinus congestion.  NECK: No pain or stiffness. CARDIOVASCULAR: No chest pain or pressure. No palpitations. PULMONARY: No shortness of breath, cough or wheeze. GASTROINTESTINAL: No abdominal pain, nausea, vomiting or diarrhea, melena or bright red blood per rectum. GENITOURINARY: No urinary frequency, urgency, hesitancy or dysuria. MUSCULOSKELETAL: No joint or muscle pain, no back pain, no recent trauma. DERMATOLOGIC: No rash, no itching, no lesions. ENDOCRINE: No polyuria, polydipsia, no heat or cold intolerance. No recent change in weight. HEMATOLOGICAL: No anemia or easy bruising or bleeding. NEUROLOGIC: No headache, seizures, numbness, tingling or weakness. PSYCHIATRIC: No depression, no loss of interest in normal activity or change in sleep pattern.     Exam: chaperone present  BP 166/104  Ht 5' 5.25" (1.657 m)  Wt 195 lb (88.451 kg)  BMI 32.20 kg/m2  LMP 10/28/2010  Body mass index is 32.20 kg/(m^2).  General appearance : Well developed well nourished female. No acute distress HEENT: Neck supple, trachea midline, no carotid bruits, no thyroidmegaly Lungs: Clear to auscultation, no rhonchi or wheezes, or rib retractions  Heart: Regular rate and rhythm, no murmurs or gallops Breast:Examined in sitting and supine position were symmetrical in appearance, no palpable masses or tenderness,  no skin retraction, no nipple inversion, no nipple discharge, no skin discoloration, no axillary or supraclavicular lymphadenopathy Abdomen: no palpable masses or tenderness, no rebound or  guarding Extremities: no edema or skin discoloration or tenderness  Pelvic:  Bartholin, Urethra, Skene Glands: Within normal limits             Vagina: No gross lesions or discharge  Cervix: No gross lesions or discharge  Uterus  anteverted, normal size, shape and consistency, non-tender and mobile  Adnexa  Without masses or tenderness  Anus and perineum  normal   Rectovaginal  normal sphincter tone without palpated masses or  tenderness             Hemoccult cards provided     Assessment/Plan:  52 y.o. female for annual exam with persistently elevated blood pressure. Patient will be sent referred to her primary physician Dr. Kelle Darting who will see her shortly after she leaves the office today. He may be making adjustments on her hypertension medication. Patient will have the following labs done today: TSH, hemoglobin A1c: Screening cholesterol: CBC urinalysis: Comprehensive metabolic panel. No Pap smear done today. New Pap smear screening guidelines discussed. Hemoccult cards were provided for the patient to submit to the office for testing later. Patient to schedule her bone density study here in our office. We discussed importance of calcium vitamin D and regular exercise for osteoporosis prevention as well as discontinuation of her smoking habit. Detrimental effects of smoking were discussed as well. She was room on and also to do her monthly self breast examination as well.:    Ok Edwards MD, 6:59 AM 05/03/2012

## 2012-05-04 ENCOUNTER — Ambulatory Visit: Payer: BC Managed Care – PPO | Admitting: Family Medicine

## 2012-05-04 ENCOUNTER — Other Ambulatory Visit: Payer: BC Managed Care – PPO

## 2012-05-05 ENCOUNTER — Other Ambulatory Visit: Payer: BC Managed Care – PPO

## 2012-05-05 DIAGNOSIS — E7800 Pure hypercholesterolemia, unspecified: Secondary | ICD-10-CM

## 2012-05-05 LAB — LIPID PANEL
HDL: 49 mg/dL (ref >39)
LDL Cholesterol: 167 mg/dL — ABNORMAL HIGH (ref 0–99)
Total CHOL/HDL Ratio: 5 Ratio
VLDL: 30 mg/dL (ref 0–40)

## 2012-05-09 ENCOUNTER — Ambulatory Visit (INDEPENDENT_AMBULATORY_CARE_PROVIDER_SITE_OTHER): Payer: BC Managed Care – PPO | Admitting: Family Medicine

## 2012-05-09 ENCOUNTER — Encounter: Payer: Self-pay | Admitting: Family Medicine

## 2012-05-09 VITALS — BP 140/90 | Temp 98.4°F | Wt 189.0 lb

## 2012-05-09 DIAGNOSIS — E781 Pure hyperglyceridemia: Secondary | ICD-10-CM

## 2012-05-09 DIAGNOSIS — F17200 Nicotine dependence, unspecified, uncomplicated: Secondary | ICD-10-CM

## 2012-05-09 DIAGNOSIS — I10 Essential (primary) hypertension: Secondary | ICD-10-CM

## 2012-05-09 NOTE — Patient Instructions (Signed)
Continue your blood pressure medication daily and check a blood pressure daily in the morning  Continue the Chantix one half tab daily and taper as outlined  Begin a walking program and avoid fat. Followup lipid panel in 6 months  Motrin 600 mg twice daily when necessary for headache  Followup here in 4 weeks

## 2012-05-09 NOTE — Progress Notes (Signed)
  Subjective:    Patient ID: Alexandria Cruz, female    DOB: [birth year], 52 y.o.   MRN: [id number]  HPI Alexandria Cruz is a 52 year old female smoker who comes in today for followup of hypertension and tobacco abuse  We repeat started her medication last week because her blood pressure was markedly elevated. It was in the 2:30 to 240 range systolic the diastolic was around 140. We gave her 10 mg a beta blocker and 0.2 mg of clonidine here in the office and sent her home at bed rest with Hyzaar 100-25 daily. She's now ambulatory BP 140/90 no side effects to medication  She started the Chantix one half tab daily and discomfort cigarette consumption from 20 cigarettes per day to 5. No side effects except for slight headache.  Reviewing her lab work everything looks fairly normal except for a high LDL of 167 HDL 49 triglycerides normal at 152     Review of Systems Review of systems otherwise negative,,,,,,,, she is just beginning an exercise program    Objective:   Physical Exam Well-developed well-nourished female no acute distress BP right arm sitting position 140/90 pulse 70 and regular       Assessment & Plan:  Hypertension approaching goal.........Marland Kitchen 135/85 or less........... continue current medication followup in one month  Tobacco abuse marketed decrease in smoking from 20 a day this 5 a day......... continue tapering and Chantix one half tab daily  Hyperlipidemia..........Marland Kitchen begin diet and exercise program followup lipids in 6 months

## 2012-05-20 ENCOUNTER — Other Ambulatory Visit: Payer: Self-pay

## 2012-06-06 ENCOUNTER — Ambulatory Visit: Payer: BC Managed Care – PPO | Admitting: Family Medicine

## 2012-06-27 ENCOUNTER — Ambulatory Visit: Payer: BC Managed Care – PPO | Admitting: Family Medicine

## 2012-07-18 ENCOUNTER — Ambulatory Visit: Payer: BC Managed Care – PPO | Admitting: Family Medicine

## 2012-07-27 ENCOUNTER — Encounter: Payer: Self-pay | Admitting: Gastroenterology

## 2012-11-29 ENCOUNTER — Emergency Department (HOSPITAL_COMMUNITY)
Admission: EM | Admit: 2012-11-29 | Discharge: 2012-11-29 | Disposition: A | Discharge status: Home / Self Care | Payer: Worker's Compensation | Attending: Emergency Medicine | Admitting: Emergency Medicine

## 2012-11-29 ENCOUNTER — Emergency Department (HOSPITAL_COMMUNITY): Payer: Worker's Compensation

## 2012-11-29 ENCOUNTER — Encounter (HOSPITAL_COMMUNITY): Payer: Self-pay | Admitting: Emergency Medicine

## 2012-11-29 DIAGNOSIS — W292XXA Contact with other powered household machinery, initial encounter: Secondary | ICD-10-CM | POA: Insufficient documentation

## 2012-11-29 DIAGNOSIS — I10 Essential (primary) hypertension: Secondary | ICD-10-CM | POA: Insufficient documentation

## 2012-11-29 DIAGNOSIS — F329 Major depressive disorder, single episode, unspecified: Secondary | ICD-10-CM | POA: Insufficient documentation

## 2012-11-29 DIAGNOSIS — Z79899 Other long term (current) drug therapy: Secondary | ICD-10-CM | POA: Insufficient documentation

## 2012-11-29 DIAGNOSIS — Y990 Civilian activity done for income or pay: Secondary | ICD-10-CM | POA: Insufficient documentation

## 2012-11-29 DIAGNOSIS — Z8742 Personal history of other diseases of the female genital tract: Secondary | ICD-10-CM | POA: Insufficient documentation

## 2012-11-29 DIAGNOSIS — Y929 Unspecified place or not applicable: Secondary | ICD-10-CM | POA: Insufficient documentation

## 2012-11-29 DIAGNOSIS — F17200 Nicotine dependence, unspecified, uncomplicated: Secondary | ICD-10-CM | POA: Insufficient documentation

## 2012-11-29 DIAGNOSIS — F3289 Other specified depressive episodes: Secondary | ICD-10-CM | POA: Insufficient documentation

## 2012-11-29 DIAGNOSIS — S61409A Unspecified open wound of unspecified hand, initial encounter: Secondary | ICD-10-CM | POA: Insufficient documentation

## 2012-11-29 DIAGNOSIS — T148XXA Other injury of unspecified body region, initial encounter: Secondary | ICD-10-CM

## 2012-11-29 DIAGNOSIS — Y9389 Activity, other specified: Secondary | ICD-10-CM | POA: Insufficient documentation

## 2012-11-29 MED ORDER — CEPHALEXIN 500 MG PO CAPS: 500.0000 mg | ORAL_CAPSULE | Freq: Three times a day (TID) | ORAL | Status: DC

## 2012-11-29 MED ORDER — HYDROCODONE-ACETAMINOPHEN 5-325 MG PO TABS: 1.0000 | ORAL_TABLET | Freq: Four times a day (QID) | ORAL | Status: DC | PRN

## 2012-11-29 NOTE — ED Provider Notes (Signed)
CSN: [id number]     Arrival date & time 11/29/12  1012 History   First MD Initiated Contact with Patient 11/29/12 1141     Chief Complaint  Patient presents with  . Extremity Laceration   (Consider location/radiation/quality/duration/timing/severity/associated sxs/prior Treatment) HPI Comments: 52 year old female presents emergency department with puncture wound to the webbing between the first and second finger of the left hand.  Patient was using a box cutter to open packages at work today when she missed and stabbed the webbing of her hand.  Patient is up-to-date on her tetanus vaccination.  She complains of pain and bleeding.  She says that the exact a knife was clean but did have some paper product on the blade.  Patient denies any weakness in the fingers, numbness or tingling, history of difficulty controlling bleeding.  She does not take any anticoagulants.  Injury occurred roughly 4 hours previous to her evaluation. Pain is described as throbbing, constant, nonradiating.  She has taken nothing for the pain.  Worsened with movement of the skin  The history is provided by the patient. No language interpreter was used.    Past Medical History  Diagnosis Date  . Depression   . Hypertension   . Endometriosis   . Smoker     1/2 PPD   Past Surgical History  Procedure Laterality Date  . Cesarean section      X3  . Tubal ligation    . Pelvic laparoscopy  2000    ENDOMETRIOSIS   Family History  Problem Relation Age of Onset  . Hypertension Mother   . Cancer Mother     COLON  . Cancer Maternal Grandmother     OVARIAN   History  Substance Use Topics  . Smoking status: Current Every Day Smoker -- 0.50 packs/day    Types: Cigarettes  . Smokeless tobacco: Never Used  . Alcohol Use: No   OB History   Grav Para Term Preterm Abortions TAB SAB Ect Mult Living   3 3 3       3      Review of Systems  Musculoskeletal: Negative for joint swelling.  Skin: Positive for wound.   Neurological: Negative for weakness and numbness.  Hematological: Does not bruise/bleed easily.    Allergies  Codeine and Minocycline hcl  Home Medications   Current Outpatient Rx  Name  Route  Sig  Dispense  Refill  . losartan-hydrochlorothiazide (HYZAAR) 100-25 MG per tablet   Oral   Take 1 tablet by mouth daily.   90 tablet   3   . sertraline (ZOLOFT) 100 MG tablet   Oral   Take 200 mg by mouth daily.            BP 151/117  Pulse 68  Temp(Src) 99.9 F (37.7 C) (Oral)  Resp 20  SpO2 100%  LMP 10/28/2010 Physical Exam  Constitutional: She is oriented to person, place, and time. She appears well-developed and well-nourished. No distress.  HENT:  Head: Normocephalic and atraumatic.  Eyes: Conjunctivae are normal. No scleral icterus.  Neck: Normal range of motion.  Cardiovascular: Normal rate, regular rhythm and normal heart sounds.  Exam reveals no gallop and no friction rub.   No murmur heard. Pulmonary/Chest: Effort normal and breath sounds normal. No respiratory distress.  Abdominal: Soft. Bowel sounds are normal. She exhibits no distension and no mass. There is no tenderness. There is no guarding.  Neurological: She is alert and oriented to person, place, and time.  Strength and  sensation noted in the fingers.  Capillary refill less than 3 seconds  Skin: Skin is warm and dry. She is not diaphoretic.  2 cm stab wound to the widening of the left hand between the first and second finger.  Bleeding is well controlled at this time.  No jagged edges.  No foreign body seen or palpated.    ED Course  Procedures (including critical care time) Labs Review Labs Reviewed - No data to display Imaging Review Dg Hand Complete Left  11/29/2012   CLINICAL DATA:  Laceration.  Th  EXAM: LEFT HAND - COMPLETE 3+ VIEW  COMPARISON:  None.  FINDINGS: No acute bony abnormality. Specifically, no fracture, subluxation, or dislocation. Soft tissues are intact. No radiopaque foreign  body.  IMPRESSION: No acute bony abnormality.   Electronically Signed   By: Charlett Nose   On: 11/29/2012 11:03    MDM   1. Puncture wound    1:05 PM  Patient with mild hypertension in initial triage.  Likely secondary to her pain.  The patient has a puncture wound to the hand.  I did anesthetize the area with approximately 4 mL of 2% lidocaine with epinephrine.  Bleeding was well controlled.  I washed the wound thoroughly with pressure irrigation.  Sterile bandage was applied.  Patient will be discharged with Keflex and pain medications.  Wound check within the week.  By her primary care physician.The patient appears reasonably screened and/or stabilized for discharge and I doubt any other medical condition or other [facility] requiring further screening, evaluation, or treatment in the ED at this time prior to discharge.     Arthor Captain, PA-C 11/29/12 1307

## 2012-11-29 NOTE — ED Notes (Signed)
Pt states she was trying to open a box at work with a box cutter.  The knife slipped and sliced the web in between her thumb and first finger on her left hand.

## 2012-11-29 NOTE — Progress Notes (Signed)
[facility] did not see patient but will be sending information about the [facility], using the address provided.

## 2012-11-29 NOTE — ED Provider Notes (Signed)
Medical screening examination/treatment/procedure(s) were performed by non-physician practitioner and as supervising physician I was immediately available for consultation/collaboration.   Shanna Cisco, MD 11/29/12 1556

## 2013-02-08 ENCOUNTER — Other Ambulatory Visit: Payer: Self-pay

## 2013-08-16 ENCOUNTER — Ambulatory Visit: Payer: Self-pay | Admitting: Family Medicine

## 2013-08-16 ENCOUNTER — Telehealth: Payer: Self-pay | Admitting: Family Medicine

## 2013-08-16 NOTE — Telephone Encounter (Signed)
Pt canceled her 1 pm appointment today at [phone].  She states she woke up vomiting and didn't want to share her germs.  Pt states she will callback at a later time to r/s.

## 2013-08-16 NOTE — Telephone Encounter (Signed)
noted 

## 2013-12-20 ENCOUNTER — Ambulatory Visit: Payer: Worker's Compensation | Admitting: Family Medicine

## 2014-01-18 ENCOUNTER — Other Ambulatory Visit: Payer: Self-pay

## 2014-02-04 ENCOUNTER — Encounter (HOSPITAL_COMMUNITY): Payer: Self-pay | Admitting: Emergency Medicine

## 2014-04-15 ENCOUNTER — Encounter: Payer: Self-pay | Admitting: Family Medicine

## 2014-04-15 ENCOUNTER — Ambulatory Visit (INDEPENDENT_AMBULATORY_CARE_PROVIDER_SITE_OTHER): Payer: BLUE CROSS/BLUE SHIELD | Admitting: Family Medicine

## 2014-04-15 VITALS — BP 120/84 | Temp 99.1°F

## 2014-04-15 DIAGNOSIS — G4700 Insomnia, unspecified: Secondary | ICD-10-CM

## 2014-04-15 DIAGNOSIS — I10 Essential (primary) hypertension: Secondary | ICD-10-CM

## 2014-04-15 LAB — POCT URINALYSIS DIPSTICK
Bilirubin, UA: NEGATIVE
Glucose, UA: NEGATIVE
KETONES UA: NEGATIVE
Leukocytes, UA: NEGATIVE
Nitrite, UA: NEGATIVE
PH UA: 5.5
PROTEIN UA: NEGATIVE
Spec Grav, UA: 1.01
Urobilinogen, UA: 0.2

## 2014-04-15 MED ORDER — LORAZEPAM 1 MG PO TABS: ORAL_TABLET | ORAL | Status: DC

## 2014-04-15 MED ORDER — SERTRALINE HCL 100 MG PO TABS: 200.0000 mg | ORAL_TABLET | Freq: Every day | ORAL | Status: DC

## 2014-04-15 MED ORDER — TRAZODONE HCL 50 MG PO TABS: 25.0000 mg | ORAL_TABLET | Freq: Every evening | ORAL | Status: DC | PRN

## 2014-04-15 MED ORDER — LOSARTAN POTASSIUM-HCTZ 100-25 MG PO TABS: 1.0000 | ORAL_TABLET | Freq: Every day | ORAL | Status: DC

## 2014-04-15 NOTE — Progress Notes (Signed)
Pre visit review using our clinic review tool, if applicable. No additional management support is needed unless otherwise documented below in the visit note. 

## 2014-04-15 NOTE — Progress Notes (Signed)
   Subjective:    Patient ID: Genia Haroldhristine V Jewel, female    DOB: 04/11/1960, 54 y.o.   MRN: [id number]  HPI [name] is a 54 year old married female nonsmoker G3 P3......... a year ago her 54 year old daughter overdosed on heroin and died. It was her first exposure to drugs...Marland Kitchen.Marland Kitchen.Marland Kitchen. [name] is been going to counseling but still has insomnia and depression which doesn't seem to be improved by the Zoloft.  She takes Hyzaar 100-25 daily for hypertension BP 120/84  Last physical 2 years ago LMP 5 years ago asymptomatic   Review of Systems Review of systems negative    Objective:   Physical Exam  Well-developed well-nourished female no acute distress vital signs stable she's afebrile      Assessment & Plan:  Insomnia secondary to the death of her daughter.......... trial of Ativan and trazodone  History of mild depression continue Zoloft  Hypertension continue Hyzaar  Check labs today's follow-up in one month

## 2014-04-15 NOTE — Patient Instructions (Signed)
Ativan 1 mg.......... one half tab daily at bedtime  Trazodone 50 mg.......Marland Kitchen. 1 at bedtime  Continue the Zoloft in the morning  Continue the Hyzaar for good blood pressure control  Labs today  Set up a 30% appointment sometime in the next 4-6 weeks for follow-up

## 2014-04-16 LAB — BASIC METABOLIC PANEL
BUN: 12 mg/dL (ref 6–23)
CALCIUM: 9.2 mg/dL (ref 8.4–10.5)
CO2: 23 meq/L (ref 19–32)
Chloride: 105 mEq/L (ref 96–112)
Creatinine, Ser: 1 mg/dL (ref 0.4–1.2)
GFR: 65.15 mL/min (ref >60.00)
GLUCOSE: 94 mg/dL (ref 70–99)
Potassium: 4 mEq/L (ref 3.5–5.1)
Sodium: 136 mEq/L (ref 135–145)

## 2014-04-16 LAB — CBC WITH DIFFERENTIAL/PLATELET
BASOS ABS: 0 10*3/uL (ref 0.0–0.1)
Basophils Relative: 0.4 % (ref 0.0–3.0)
EOS ABS: 0.2 10*3/uL (ref 0.0–0.7)
Eosinophils Relative: 2 % (ref 0.0–5.0)
HCT: 51.2 % — ABNORMAL HIGH (ref 36.0–46.0)
Hemoglobin: 16.9 g/dL — ABNORMAL HIGH (ref 12.0–15.0)
LYMPHS ABS: 2.1 10*3/uL (ref 0.7–4.0)
Lymphocytes Relative: 23.7 % (ref 12.0–46.0)
MCHC: 33 g/dL (ref 30.0–36.0)
MCV: 94.5 fl (ref 78.0–100.0)
Monocytes Absolute: 0.6 10*3/uL (ref 0.1–1.0)
Monocytes Relative: 6.5 % (ref 3.0–12.0)
NEUTROS ABS: 5.9 10*3/uL (ref 1.4–7.7)
Neutrophils Relative %: 67.4 % (ref 43.0–77.0)
PLATELETS: 399 10*3/uL (ref 150.0–400.0)
RBC: 5.41 Mil/uL — AB (ref 3.87–5.11)
RDW: 13.4 % (ref 11.5–15.5)
WBC: 8.8 10*3/uL (ref 4.0–10.5)

## 2014-04-16 LAB — HEPATIC FUNCTION PANEL
ALT: 28 U/L (ref 0–35)
AST: 22 U/L (ref 0–37)
Albumin: 4.5 g/dL (ref 3.5–5.2)
Alkaline Phosphatase: 116 U/L (ref 39–117)
BILIRUBIN DIRECT: 0 mg/dL (ref 0.0–0.3)
BILIRUBIN TOTAL: 0.5 mg/dL (ref 0.2–1.2)
Total Protein: 7.7 g/dL (ref 6.0–8.3)

## 2014-04-16 LAB — TSH: TSH: 1.12 u[IU]/mL (ref 0.35–4.50)

## 2014-04-16 LAB — LIPID PANEL
CHOL/HDL RATIO: 5
Cholesterol: 265 mg/dL — ABNORMAL HIGH (ref 0–200)
HDL: 56.4 mg/dL (ref >39.00)
LDL CALC: 177 mg/dL — AB (ref 0–99)
NonHDL: 208.6
Triglycerides: 156 mg/dL — ABNORMAL HIGH (ref 0.0–149.0)
VLDL: 31.2 mg/dL (ref 0.0–40.0)

## 2014-05-13 ENCOUNTER — Ambulatory Visit: Payer: Self-pay | Admitting: Family Medicine

## 2014-06-11 ENCOUNTER — Ambulatory Visit: Payer: Self-pay | Admitting: Family Medicine

## 2014-08-16 ENCOUNTER — Telehealth: Payer: Self-pay | Admitting: *Deleted

## 2014-08-16 NOTE — Telephone Encounter (Signed)
Left message on machine for patient to call back and give results of last mammogram.

## 2014-10-24 ENCOUNTER — Encounter: Payer: Self-pay | Admitting: *Deleted

## 2015-08-22 ENCOUNTER — Ambulatory Visit (INDEPENDENT_AMBULATORY_CARE_PROVIDER_SITE_OTHER): Payer: BLUE CROSS/BLUE SHIELD | Admitting: Family Medicine

## 2015-08-22 ENCOUNTER — Encounter: Payer: Self-pay | Admitting: Family Medicine

## 2015-08-22 VITALS — BP 138/88 | HR 99 | Temp 98.7°F | Resp 18 | Ht 66.0 in | Wt 198.0 lb

## 2015-08-22 DIAGNOSIS — F17200 Nicotine dependence, unspecified, uncomplicated: Secondary | ICD-10-CM

## 2015-08-22 DIAGNOSIS — F3341 Major depressive disorder, recurrent, in partial remission: Secondary | ICD-10-CM | POA: Diagnosis not present

## 2015-08-22 DIAGNOSIS — G4700 Insomnia, unspecified: Secondary | ICD-10-CM

## 2015-08-22 DIAGNOSIS — E782 Mixed hyperlipidemia: Secondary | ICD-10-CM

## 2015-08-22 DIAGNOSIS — I10 Essential (primary) hypertension: Secondary | ICD-10-CM

## 2015-08-22 DIAGNOSIS — Z1239 Encounter for other screening for malignant neoplasm of breast: Secondary | ICD-10-CM

## 2015-08-22 DIAGNOSIS — D582 Other hemoglobinopathies: Secondary | ICD-10-CM

## 2015-08-22 LAB — LIPID PANEL
CHOLESTEROL: 223 mg/dL — AB (ref 125–200)
HDL: 59 mg/dL (ref >=46)
LDL CALC: 128 mg/dL (ref <130)
TRIGLYCERIDES: 180 mg/dL — AB (ref <150)
Total CHOL/HDL Ratio: 3.8 Ratio (ref <=5.0)
VLDL: 36 mg/dL — ABNORMAL HIGH (ref <30)

## 2015-08-22 LAB — CBC WITH DIFFERENTIAL/PLATELET
Basophils Absolute: 0 cells/uL (ref 0–200)
Basophils Relative: 0 %
EOS PCT: 3 %
Eosinophils Absolute: 360 cells/uL (ref 15–500)
HCT: 50.2 % — ABNORMAL HIGH (ref 35.0–45.0)
HEMOGLOBIN: 17.3 g/dL — AB (ref 11.7–15.5)
LYMPHS ABS: 2880 {cells}/uL (ref 850–3900)
Lymphocytes Relative: 24 %
MCH: 32.2 pg (ref 27.0–33.0)
MCHC: 34.5 g/dL (ref 32.0–36.0)
MCV: 93.3 fL (ref 80.0–100.0)
MPV: 9.1 fL (ref 7.5–12.5)
Monocytes Absolute: 840 cells/uL (ref 200–950)
Monocytes Relative: 7 %
NEUTROS ABS: 7920 {cells}/uL — AB (ref 1500–7800)
Neutrophils Relative %: 66 %
Platelets: 420 10*3/uL — ABNORMAL HIGH (ref 140–400)
RBC: 5.38 MIL/uL — AB (ref 3.80–5.10)
RDW: 13.3 % (ref 11.0–15.0)
WBC: 12 10*3/uL — AB (ref 3.8–10.8)

## 2015-08-22 LAB — COMPREHENSIVE METABOLIC PANEL
ALBUMIN: 4.4 g/dL (ref 3.6–5.1)
ALT: 26 U/L (ref 6–29)
AST: 16 U/L (ref 10–35)
Alkaline Phosphatase: 115 U/L (ref 33–130)
BILIRUBIN TOTAL: 0.6 mg/dL (ref 0.2–1.2)
BUN: 15 mg/dL (ref 7–25)
CO2: 22 mmol/L (ref 20–31)
CREATININE: 0.93 mg/dL (ref 0.50–1.05)
Calcium: 9.9 mg/dL (ref 8.6–10.4)
Chloride: 101 mmol/L (ref 98–110)
Glucose, Bld: 92 mg/dL (ref 65–99)
Potassium: 3.3 mmol/L — ABNORMAL LOW (ref 3.5–5.3)
SODIUM: 138 mmol/L (ref 135–146)
TOTAL PROTEIN: 6.8 g/dL (ref 6.1–8.1)

## 2015-08-22 MED ORDER — QUETIAPINE FUMARATE 100 MG PO TABS: 100.0000 mg | ORAL_TABLET | Freq: Every day | ORAL | Status: DC

## 2015-08-22 MED ORDER — LOSARTAN POTASSIUM-HCTZ 100-25 MG PO TABS: 1.0000 | ORAL_TABLET | Freq: Every day | ORAL | Status: DC

## 2015-08-22 MED ORDER — SERTRALINE HCL 100 MG PO TABS: 100.0000 mg | ORAL_TABLET | Freq: Every day | ORAL | Status: DC

## 2015-08-22 MED ORDER — NICOTINE 21 MG/24HR TD PT24: 21.0000 mg | MEDICATED_PATCH | Freq: Every day | TRANSDERMAL | Status: DC

## 2015-08-22 NOTE — Progress Notes (Signed)
Pre visit review using our clinic review tool, if applicable. No additional management support is needed unless otherwise documented below in the visit note. 

## 2015-08-22 NOTE — Patient Instructions (Signed)
A few things to remember from today's visit:   1. Essential hypertension  - losartan-hydrochlorothiazide (HYZAAR) 100-25 MG tablet; Take 1 tablet by mouth daily.  Dispense: 90 tablet; Refill: 2 - Comprehensive metabolic panel - CBC  2. TOBACCO ABUSE   3. Insomnia  - QUEtiapine (SEROQUEL) 100 MG tablet; Take 1 tablet (100 mg total) by mouth at bedtime.  Dispense: 30 tablet; Refill: 1  4. Depression, major, recurrent, in partial remission (HCC)  - QUEtiapine (SEROQUEL) 100 MG tablet; Take 1 tablet (100 mg total) by mouth at bedtime.  Dispense: 30 tablet; Refill: 1 - sertraline (ZOLOFT) 100 MG tablet; Take 1 tablet (100 mg total) by mouth daily.  Dispense: 90 tablet; Refill: 2  5. Hyperlipidemia, mixed  - Lipid panel - Comprehensive metabolic panel   Good sleep hygiene. Regular exercise, healthy diet, do not skip meals.  Please schedule f/u visit before leaving.     If you sign-up for My chart, you can communicate easier with us in case you have any question or concern.

## 2015-08-22 NOTE — Progress Notes (Signed)
Subjective:    Patient ID: Alexandria Cruz, female    DOB: [birth year], 55 y.o.   MRN: [id number]  HPI   Ms. Alexandria Cruz is a 55 y.o.female here today to establish care with me. 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  She has Hx of depression, anxiety,insomnia,HTN, and HLD among some. Last routine physical was 3 years ago.  She does not follow a healthy diet consistently, she skips meals; and does exercises regularly.  Concerns today : refill medications, smoking cessation, insomnia.    Hypertension: Currently she is on Hyzaar 100-25 mg. She taking medications as instructed, no side effects reported.  She has not noted unusual headache, visual changes, exertional chest pain, dyspnea,  focal weakness, or edema.    Lab Results  Component Value Date   CREATININE 1.0 04/15/2014   BUN 12 04/15/2014   NA 136 04/15/2014   K 4.0 04/15/2014   CL 105 04/15/2014   CO2 23 04/15/2014   BP at home 130/80.   Hyperlipidemia: She  is on non pharmacologic treatment. Not following a low fat diet consistently.   Lab Results  Component Value Date   CHOL 265* 04/15/2014   HDL 56.40 04/15/2014   LDLCALC 177* 04/15/2014   TRIG 156.0* 04/15/2014   CHOLHDL 5 04/15/2014    + Smoker. Lab Results  Component Value Date   WBC 8.8 04/15/2014   HGB 16.9* 04/15/2014   HCT 51.2* 04/15/2014   MCV 94.5 04/15/2014   PLT 399.0 04/15/2014    Depression: On Zoloft for "many years", still helping. She tried other medication but this one seems to be the one that helps the most. She lives with husband and son. Low motivation, decreased appetite, not sleeping well. She denies suicidal thoughts or Hx of bipolar. She has seen psychiatrists in the past, last seen 3-4 months ago, does not want to go back, did not think it was making a difference.  Daughter with depression, committed suicide.  Insomnia: Chronic, she has tried a few medications in the past. She attributes problem to anxiety, "cannot shut  down my mind." 3 hours in average. Trazodone 50 mg did not help. Ambien 10 mg , slept for about 6 hours , felt rested.  She was also on Lorazepam a year ago. She is afraid of medications with risk of addition.   Review of Systems  Constitutional: Positive for fatigue (no more than usual). Negative for fever, activity change, appetite change and unexpected weight change.  HENT: Negative for mouth sores, nosebleeds and trouble swallowing.   Eyes: Negative for redness and visual disturbance.  Respiratory: Negative for cough, shortness of breath and wheezing.   Cardiovascular: Negative for chest pain, palpitations and leg swelling.  Gastrointestinal: Negative for nausea, vomiting and abdominal pain.       Negative for changes in bowel habits.  Genitourinary: Negative for dysuria, hematuria, decreased urine volume and difficulty urinating.  Skin: Negative for color change and rash.  Neurological: Negative for seizures, syncope, weakness, numbness and headaches.  Psychiatric/Behavioral: Positive for sleep disturbance. Negative for suicidal ideas, hallucinations, confusion and self-injury. The patient is nervous/anxious.       Current Outpatient Prescriptions on File Prior to Visit  Medication Sig Dispense Refill  . LORazepam (ATIVAN) 1 MG tablet One half tab daily at bedtime (Patient not taking: Reported on 08/22/2015) 30 tablet 3  . traZODone (DESYREL) 50 MG tablet Take 0.5-1 tablets (25-50 mg total) by mouth at bedtime as needed for sleep. (Patient not taking: Reported  on 08/22/2015) 100 tablet 3   No current facility-administered medications on file prior to visit.     Past Medical History  Diagnosis Date  . Depression   . Hypertension   . Endometriosis   . Smoker     1/2 PPD    Social History   Social History  . Marital Status: Married    Spouse Name: N/A  . Number of Children: N/A  . Years of Education: N/A   Social History Main Topics  . Smoking status: Current Every  Day Smoker -- 0.50 packs/day    Types: Cigarettes  . Smokeless tobacco: Never Used  . Alcohol Use: No  . Drug Use: No  . Sexual Activity:    Partners: Male    Birth Control/ Protection: Surgical     Comment: TUBAL LIGATION   Other Topics Concern  . None   Social History Narrative    Filed Vitals:   08/22/15 1551  BP: 138/88  Pulse: 99  Temp: 98.7 F (37.1 C)  Resp: 18   Body mass index is 31.97 kg/(m^2).  SpO2 Readings from Last 3 Encounters:  08/22/15 95%  11/29/12 100%        Objective:   Physical Exam  Constitutional: She is oriented to person, place, and time. She appears well-developed. She does not appear ill. No distress.  HENT:  Head: Atraumatic.  Mouth/Throat: Uvula is midline, oropharynx is clear and moist and mucous membranes are normal.  Eyes: Conjunctivae and EOM are normal. Pupils are equal, round, and reactive to light.  Neck: Neck supple.  Cardiovascular: Normal rate, regular rhythm and normal heart sounds.   No murmur heard. Pulses:      Dorsalis pedis pulses are 2+ on the right side, and 2+ on the left side.  Pulmonary/Chest: Effort normal and breath sounds normal. She has no wheezes. She has no rales. She exhibits no tenderness.  Abdominal: Soft. She exhibits no mass. There is no tenderness.  Musculoskeletal: She exhibits no edema.  Lymphadenopathy:    She has no cervical adenopathy.  Neurological: She is alert and oriented to person, place, and time. She has normal strength. Coordination and gait normal.  Psychiatric: Her behavior is normal. She exhibits a depressed mood. She expresses no suicidal ideation. She expresses no suicidal plans.  Well groomed, good eye contact.  Nursing note and vitals reviewed.       Assessment & Plan:    Alexandria Cruz was seen today for medication refill and insomnia.  Diagnoses and all orders for this visit:  Essential hypertension  Adequately controlled. No changes in current management. Low salt  diet. Eye exam recommended annually. F/U in 6 months, before if needed.  -     losartan-hydrochlorothiazide (HYZAAR) 100-25 MG tablet; Take 1 tablet by mouth daily. -     Comprehensive metabolic panel -     Lipid panel  TOBACCO ABUSE  Adverse effects discussed, willing to try Nicotine patches, some side effects discussed.   -     nicotine (EQ NICOTINE) 21 mg/24hr patch; Place 1 patch (21 mg total) onto the skin daily.  Insomnia  After discussion or a few treatment options, she would like to try Seroquel. She will start with 1/2 tabs, some side effects discussed. Good sleep hygiene.  F/U in 4-6 weeks, before if needed.  -     QUEtiapine (SEROQUEL) 100 MG tablet; Take 1 tablet (100 mg total) by mouth at bedtime.  Depression, major, recurrent, in partial remission (HCC)  Does  not seem well controlled. Seroquel may help. No changes in Zoloft. Regular exercise recommended. Instructed about warning signs.  -     QUEtiapine (SEROQUEL) 100 MG tablet; Take 1 tablet (100 mg total) by mouth at bedtime. -     sertraline (ZOLOFT) 100 MG tablet; Take 1 tablet (100 mg total) by mouth daily. -     CBC with Differential/Platelet  Hyperlipidemia, mixed  Low fat diet recommended for now, will follow labs done today and will give further recommendations accordingly.  -     Comprehensive metabolic panel -     Lipid panel  Breast cancer screening -     [facility]  Elevated hemoglobin (HCC)  Most likely related with tobacco use, no symptoms of COPD. Further recommendations will be given accordingly.  -     CBC with Differential/Platelet       -Patient advised to return or notify a doctor immediately if symptoms worsen or persist or new concerns arise.  Reilly Blades G. [place], MD  [facility]. [place] office.

## 2015-08-24 ENCOUNTER — Encounter: Payer: Self-pay | Admitting: Family Medicine

## 2015-08-25 ENCOUNTER — Encounter: Payer: Self-pay | Admitting: Family Medicine

## 2015-08-26 ENCOUNTER — Other Ambulatory Visit: Payer: Self-pay | Admitting: Family Medicine

## 2015-08-26 DIAGNOSIS — F3341 Major depressive disorder, recurrent, in partial remission: Secondary | ICD-10-CM

## 2015-08-26 DIAGNOSIS — F17200 Nicotine dependence, unspecified, uncomplicated: Secondary | ICD-10-CM

## 2015-08-26 MED ORDER — SERTRALINE HCL 100 MG PO TABS: 100.0000 mg | ORAL_TABLET | Freq: Every day | ORAL | Status: DC

## 2015-08-26 MED ORDER — NICOTINE 21 MG/24HR TD PT24: 21.0000 mg | MEDICATED_PATCH | Freq: Every day | TRANSDERMAL | Status: DC

## 2015-10-03 ENCOUNTER — Ambulatory Visit: Payer: Self-pay | Admitting: Family Medicine

## 2015-10-30 ENCOUNTER — Ambulatory Visit: Payer: Self-pay | Admitting: Family Medicine

## 2015-12-15 ENCOUNTER — Ambulatory Visit: Payer: Self-pay | Admitting: Family Medicine

## 2015-12-15 DIAGNOSIS — Z0289 Encounter for other administrative examinations: Secondary | ICD-10-CM

## 2016-10-02 ENCOUNTER — Other Ambulatory Visit: Payer: Self-pay | Admitting: Family Medicine

## 2016-10-02 DIAGNOSIS — F3341 Major depressive disorder, recurrent, in partial remission: Secondary | ICD-10-CM

## 2016-10-19 ENCOUNTER — Other Ambulatory Visit: Payer: Self-pay | Admitting: Family Medicine

## 2016-10-19 DIAGNOSIS — I10 Essential (primary) hypertension: Secondary | ICD-10-CM

## 2016-12-13 ENCOUNTER — Other Ambulatory Visit: Payer: Self-pay | Admitting: Family Medicine

## 2016-12-13 DIAGNOSIS — F3341 Major depressive disorder, recurrent, in partial remission: Secondary | ICD-10-CM

## 2016-12-14 ENCOUNTER — Encounter: Payer: Self-pay | Admitting: Family Medicine

## 2016-12-14 ENCOUNTER — Ambulatory Visit (INDEPENDENT_AMBULATORY_CARE_PROVIDER_SITE_OTHER): Payer: Managed Care, Other (non HMO) | Admitting: Family Medicine

## 2016-12-14 VITALS — BP 158/106 | HR 84 | Temp 98.8°F | Wt 203.0 lb

## 2016-12-14 DIAGNOSIS — F3341 Major depressive disorder, recurrent, in partial remission: Secondary | ICD-10-CM | POA: Diagnosis not present

## 2016-12-14 DIAGNOSIS — I10 Essential (primary) hypertension: Secondary | ICD-10-CM | POA: Diagnosis not present

## 2016-12-14 DIAGNOSIS — Z23 Encounter for immunization: Secondary | ICD-10-CM | POA: Diagnosis not present

## 2016-12-14 MED ORDER — LOSARTAN POTASSIUM-HCTZ 100-25 MG PO TABS: ORAL_TABLET | ORAL | 4 refills | Status: DC

## 2016-12-14 MED ORDER — ZOLPIDEM TARTRATE 5 MG PO TABS: 5.0000 mg | ORAL_TABLET | Freq: Every evening | ORAL | 5 refills | Status: DC | PRN

## 2016-12-14 MED ORDER — SERTRALINE HCL 100 MG PO TABS: 100.0000 mg | ORAL_TABLET | Freq: Every day | ORAL | 4 refills | Status: DC

## 2016-12-14 MED ORDER — AMLODIPINE BESYLATE 2.5 MG PO TABS: 2.5000 mg | ORAL_TABLET | Freq: Every day | ORAL | 3 refills | Status: DC

## 2016-12-14 NOTE — Progress Notes (Signed)
Alexandria Cruz is a 56 year old married female nonsmoker who comes in today for evaluation of 3 issues  She had a physical here in May 2017. She's been on Hyzaar 100-25 daily for hypertension. BP at home is ranging in the 1:30 to 140 systolic diastolic in the 80s. Today her blood pressure 150/106.  She was recently placed on Seroquel by her psychiatrist because of depression. 2 years ago her daughter died of a narcotic overdose. She's maintained herself on Zoloft 100 mg a day at bedtime but is stop the Seroquel. At one point we tried some trazodone but that didn't help to sleep. During treatments ever helped her sleep was E Ambien. We discussed restarting the trazodone versus other options. She would like to try the Ambien on a short-term 3-6 months basis. She also agrees to resume counseling.  Her weight is 203 pounds. She's not exercising she's not following a diet. We talked about the implications of her weight with her overall long term health and its relationship to her blood pressure.  BP (!) 158/106 (BP Location: Left Arm, Patient Position: Sitting, Cuff Size: Large)   Pulse 84   Temp 98.8 F (37.1 C) (Oral)   Wt 203 lb (92.1 kg)   LMP 10/28/2010   BMI 32.77 kg/m  General she is well-developed well-nourished female no acute distress BP left arm sitting position 160/110. Pulse 70 and regular  #1 hypertension not at goal......... continue losartan add Norvasc 2.5 mg daily........ salt free diet..... Walk 30 minutes daily..... BP check daily.... Follow-up CPX 4-6 weeks  #2 overweight.......... avoid sugar......... exercises above  #3 sleep dysfunction secondary to daughter's death........ 3 month trial of Ambien continue counseling.

## 2016-12-14 NOTE — Patient Instructions (Signed)
Continue losartan 1 tablet daily in the morning  Add Norvasc 2.5 mg........ one tablet daily in the morning  Check your blood pressure daily in the morning  Return in 4-6 weeks for complete physical exam  Fasting labs one week prior  Ambien 5 mg......... one half daily at bedtime  Walk 20 minutes daily....... avoid salt and sugar

## 2016-12-24 ENCOUNTER — Encounter: Payer: Self-pay | Admitting: Family Medicine

## 2017-01-19 ENCOUNTER — Other Ambulatory Visit: Payer: Self-pay

## 2017-01-24 ENCOUNTER — Encounter: Payer: Self-pay | Admitting: Family Medicine

## 2017-02-28 ENCOUNTER — Encounter: Payer: Self-pay | Admitting: Family Medicine

## 2017-05-03 ENCOUNTER — Encounter: Payer: Self-pay | Admitting: Family Medicine

## 2017-06-14 ENCOUNTER — Telehealth: Payer: Self-pay

## 2017-06-15 ENCOUNTER — Encounter: Payer: Self-pay | Admitting: Family Medicine

## 2017-06-16 NOTE — Telephone Encounter (Signed)
This encounter was opened by error.  

## 2017-06-21 ENCOUNTER — Other Ambulatory Visit: Payer: Self-pay | Admitting: Family Medicine

## 2017-06-22 NOTE — Telephone Encounter (Signed)
A CRM was created 

## 2017-06-22 NOTE — Telephone Encounter (Signed)
Called pt left message for pt to call the office for clarification on her Ambien Rx refill request.

## 2017-06-23 ENCOUNTER — Other Ambulatory Visit: Payer: Self-pay

## 2017-06-23 MED ORDER — SERTRALINE HCL 100 MG PO TABS: 50.0000 mg | ORAL_TABLET | Freq: Every day | ORAL | 0 refills | Status: DC

## 2017-06-23 MED ORDER — LOSARTAN POTASSIUM-HCTZ 100-25 MG PO TABS: 1.0000 | ORAL_TABLET | Freq: Every day | ORAL | 3 refills | Status: DC

## 2017-06-23 NOTE — Telephone Encounter (Signed)
[facility] with no refill, pt has also been scheduled for Fasting CPE on 06/28/2017 at 9.30am .

## 2017-06-28 ENCOUNTER — Ambulatory Visit (INDEPENDENT_AMBULATORY_CARE_PROVIDER_SITE_OTHER): Payer: Managed Care, Other (non HMO) | Admitting: Family Medicine

## 2017-06-28 ENCOUNTER — Encounter: Payer: Self-pay | Admitting: Family Medicine

## 2017-06-28 ENCOUNTER — Other Ambulatory Visit (HOSPITAL_COMMUNITY)
Admission: RE | Admit: 2017-06-28 | Discharge: 2017-06-28 | Disposition: A | Discharge status: Home / Self Care | Payer: Managed Care, Other (non HMO) | Source: Ambulatory Visit | Attending: Family Medicine | Admitting: Family Medicine

## 2017-06-28 VITALS — BP 148/100 | HR 74 | Temp 98.4°F | Ht 66.0 in | Wt 211.0 lb

## 2017-06-28 DIAGNOSIS — I10 Essential (primary) hypertension: Secondary | ICD-10-CM | POA: Diagnosis not present

## 2017-06-28 DIAGNOSIS — Z124 Encounter for screening for malignant neoplasm of cervix: Secondary | ICD-10-CM

## 2017-06-28 DIAGNOSIS — E785 Hyperlipidemia, unspecified: Secondary | ICD-10-CM | POA: Diagnosis not present

## 2017-06-28 DIAGNOSIS — Z800 Family history of malignant neoplasm of digestive organs: Secondary | ICD-10-CM

## 2017-06-28 DIAGNOSIS — F3341 Major depressive disorder, recurrent, in partial remission: Secondary | ICD-10-CM

## 2017-06-28 LAB — CBC WITH DIFFERENTIAL/PLATELET
BASOS ABS: 0.1 10*3/uL (ref 0.0–0.1)
BASOS PCT: 0.8 % (ref 0.0–3.0)
EOS ABS: 0.4 10*3/uL (ref 0.0–0.7)
Eosinophils Relative: 5.8 % — ABNORMAL HIGH (ref 0.0–5.0)
HCT: 49.6 % — ABNORMAL HIGH (ref 36.0–46.0)
Hemoglobin: 16.9 g/dL — ABNORMAL HIGH (ref 12.0–15.0)
LYMPHS ABS: 1.6 10*3/uL (ref 0.7–4.0)
LYMPHS PCT: 24.3 % (ref 12.0–46.0)
MCHC: 34.1 g/dL (ref 30.0–36.0)
MCV: 97.2 fl (ref 78.0–100.0)
Monocytes Absolute: 0.6 10*3/uL (ref 0.1–1.0)
Monocytes Relative: 8.6 % (ref 3.0–12.0)
NEUTROS ABS: 3.9 10*3/uL (ref 1.4–7.7)
Neutrophils Relative %: 60.5 % (ref 43.0–77.0)
PLATELETS: 333 10*3/uL (ref 150.0–400.0)
RBC: 5.1 Mil/uL (ref 3.87–5.11)
RDW: 12.7 % (ref 11.5–15.5)
WBC: 6.4 10*3/uL (ref 4.0–10.5)

## 2017-06-28 LAB — HEPATIC FUNCTION PANEL
ALT: 31 U/L (ref 0–35)
AST: 19 U/L (ref 0–37)
Albumin: 4.3 g/dL (ref 3.5–5.2)
Alkaline Phosphatase: 100 U/L (ref 39–117)
BILIRUBIN DIRECT: 0.1 mg/dL (ref 0.0–0.3)
Total Bilirubin: 0.6 mg/dL (ref 0.2–1.2)
Total Protein: 6.7 g/dL (ref 6.0–8.3)

## 2017-06-28 LAB — POCT URINALYSIS DIPSTICK
BILIRUBIN UA: NEGATIVE
Blood, UA: NEGATIVE
Glucose, UA: NEGATIVE
KETONES UA: NEGATIVE
Leukocytes, UA: NEGATIVE
NITRITE UA: NEGATIVE
Odor: NEGATIVE
PH UA: 7 (ref 5.0–8.0)
PROTEIN UA: NEGATIVE
Spec Grav, UA: 1.015 (ref 1.010–1.025)
UROBILINOGEN UA: 0.2 U/dL

## 2017-06-28 LAB — LIPID PANEL
CHOL/HDL RATIO: 3
Cholesterol: 227 mg/dL — ABNORMAL HIGH (ref 0–200)
HDL: 69.1 mg/dL (ref >39.00)
LDL CALC: 124 mg/dL — AB (ref 0–99)
NonHDL: 157.72
TRIGLYCERIDES: 167 mg/dL — AB (ref 0.0–149.0)
VLDL: 33.4 mg/dL (ref 0.0–40.0)

## 2017-06-28 LAB — BASIC METABOLIC PANEL
BUN: 13 mg/dL (ref 6–23)
CO2: 28 mEq/L (ref 19–32)
CREATININE: 0.81 mg/dL (ref 0.40–1.20)
Calcium: 9.5 mg/dL (ref 8.4–10.5)
Chloride: 100 mEq/L (ref 96–112)
GFR: 77.4 mL/min (ref >60.00)
GLUCOSE: 111 mg/dL — AB (ref 70–99)
Potassium: 4.7 mEq/L (ref 3.5–5.1)
Sodium: 136 mEq/L (ref 135–145)

## 2017-06-28 LAB — TSH: TSH: 1.85 u[IU]/mL (ref 0.35–4.50)

## 2017-06-28 MED ORDER — SERTRALINE HCL 100 MG PO TABS: 50.0000 mg | ORAL_TABLET | Freq: Every day | ORAL | 4 refills | Status: DC

## 2017-06-28 MED ORDER — AMLODIPINE BESYLATE 5 MG PO TABS: 5.0000 mg | ORAL_TABLET | Freq: Every day | ORAL | 3 refills | Status: DC

## 2017-06-28 MED ORDER — ZOLPIDEM TARTRATE 5 MG PO TABS: ORAL_TABLET | ORAL | 0 refills | Status: DC

## 2017-06-28 NOTE — Progress Notes (Signed)
Alexandria Cruz is a 57 year old married female nonsmoker G3 P3..........one daughter recently died of a drug overdose.... Who comes in today for general physical examination because a history of hypertension  She's on losartan actually Hyzaar 100-25 daily BP not at goal. 20 140/90. She checks her blood pressure at home and a troponin at that level II  She takes 50 mg of Zoloft at bedtime along with a 5 mg Ambien when necessary for sleep. She's done that since the death of her daughter.  Her LMP was 7 years ago. Last Pap was 5 years ago. She's never had any trouble with her uterus or ovaries  Recommend routine eye care, she does get regular dental care, BSE monthly, annual mammography although she skipped 2018. She had a colonoscopy at age 57 because her mother had colon cancer. Her colonoscopy was normal. She is told to come back this year.  Weight is been up. We discussed diet exercise and weight loss  Family history mom died at 3681 of dementia she had colon cancer and survived. Father died in his late 2960s from Alzheimer's. 2 brothers 5 sisters all in good health  Vaccinations up-to-date  14 point review of systems reviewed and otherwise negative EKG was done because a history of hypertension. EKG was normal and unchanged.  BP (!) 148/100 (BP Location: Left Arm, Patient Position: Sitting, Cuff Size: Large)   Pulse 74   Temp 98.4 F (36.9 C) (Oral)   Ht 5\' 6"  (1.676 m)   Wt 211 lb (95.7 kg)   LMP 10/28/2010   BMI 34.06 kg/m  Well-developed well-nourished female no acute distress vital signs stable she's afebrile except for BP as noted above.  Examination of the HEENT were negative neck was supple thyroid is not enlarged no carotid bruits cardiopulmonary exam normal breast exam normal abdominal exam normal except for scar in the pubis from previous C-sections. Pelvic examination external genitalia within normal limits vaginal vault was normal cervix is visualized Pap smear was done bimanual  exam negative rectal normal stool guaiac negative extremities normal skin normal peripheral pulses normal  #1 hypertension not at goal......Marland Kitchen. Adenovirus 2.5........ BP check daily........ Follow-up in 4-6 weeks  #2 obesity.............. Carbohydrate free diet walking program  #3 situational depression........ Continue Zoloft and Ambien when necessary  #4 family history of colon cancer..... Mother......Marland Kitchen. Due for screening colonoscopy

## 2017-06-28 NOTE — Patient Instructions (Signed)
Call and get set up for your mammogram  I'll put in a consult request with GI for your screening colonoscopy  Increase the Norvasc to 5 mg daily........... Check your blood pressure daily return in 4-6 weeks for follow-up  . Carbohydrate free diet............ Walk 30 minutes daily...Marland Kitchen.Marland Kitchen.Marland Kitchen. With the goal to lose 12 pounds in the next 12 months  Labs today.......... I will call you if there is any problems  Continue current meds except as above  Dr. Hazle Quantigby........... Eye exam

## 2017-06-29 LAB — CYTOLOGY - PAP: DIAGNOSIS: NEGATIVE

## 2017-06-30 ENCOUNTER — Other Ambulatory Visit: Payer: Self-pay | Admitting: Family Medicine

## 2017-06-30 DIAGNOSIS — R739 Hyperglycemia, unspecified: Secondary | ICD-10-CM

## 2017-08-04 ENCOUNTER — Other Ambulatory Visit: Payer: Self-pay | Admitting: Family Medicine

## 2017-08-04 MED ORDER — ZOLPIDEM TARTRATE 5 MG PO TABS: ORAL_TABLET | ORAL | 4 refills | Status: DC

## 2017-08-04 NOTE — Telephone Encounter (Signed)
Please Advise

## 2017-08-09 ENCOUNTER — Ambulatory Visit: Payer: Self-pay | Admitting: Family Medicine

## 2017-08-15 ENCOUNTER — Ambulatory Visit: Payer: Self-pay | Admitting: Family Medicine

## 2017-08-22 ENCOUNTER — Encounter (INDEPENDENT_AMBULATORY_CARE_PROVIDER_SITE_OTHER): Payer: Self-pay

## 2017-08-24 ENCOUNTER — Ambulatory Visit: Payer: Self-pay | Admitting: Family Medicine

## 2017-12-19 ENCOUNTER — Other Ambulatory Visit: Payer: Self-pay

## 2017-12-21 ENCOUNTER — Other Ambulatory Visit: Payer: Self-pay | Admitting: Family Medicine

## 2017-12-23 ENCOUNTER — Other Ambulatory Visit: Payer: Self-pay | Admitting: Family Medicine

## 2017-12-24 ENCOUNTER — Other Ambulatory Visit: Payer: Self-pay | Admitting: Family Medicine

## 2017-12-26 ENCOUNTER — Ambulatory Visit: Payer: Self-pay | Admitting: Family Medicine

## 2018-01-27 ENCOUNTER — Other Ambulatory Visit (INDEPENDENT_AMBULATORY_CARE_PROVIDER_SITE_OTHER): Payer: Managed Care, Other (non HMO)

## 2018-01-27 DIAGNOSIS — R739 Hyperglycemia, unspecified: Secondary | ICD-10-CM | POA: Diagnosis not present

## 2018-01-27 LAB — CBC WITH DIFFERENTIAL/PLATELET
BASOS ABS: 0.1 10*3/uL (ref 0.0–0.1)
BASOS PCT: 0.7 % (ref 0.0–3.0)
EOS ABS: 0.4 10*3/uL (ref 0.0–0.7)
Eosinophils Relative: 5.2 % — ABNORMAL HIGH (ref 0.0–5.0)
HEMATOCRIT: 48.1 % — AB (ref 36.0–46.0)
HEMOGLOBIN: 16.6 g/dL — AB (ref 12.0–15.0)
Lymphocytes Relative: 22.7 % (ref 12.0–46.0)
Lymphs Abs: 1.7 10*3/uL (ref 0.7–4.0)
MCHC: 34.5 g/dL (ref 30.0–36.0)
MCV: 97.6 fl (ref 78.0–100.0)
MONOS PCT: 7.7 % (ref 3.0–12.0)
Monocytes Absolute: 0.6 10*3/uL (ref 0.1–1.0)
Neutro Abs: 4.9 10*3/uL (ref 1.4–7.7)
Neutrophils Relative %: 63.7 % (ref 43.0–77.0)
Platelets: 353 10*3/uL (ref 150.0–400.0)
RBC: 4.93 Mil/uL (ref 3.87–5.11)
RDW: 13 % (ref 11.5–15.5)
WBC: 7.6 10*3/uL (ref 4.0–10.5)

## 2018-01-27 LAB — HEMOGLOBIN A1C: Hgb A1c MFr Bld: 5.3 % (ref 4.6–6.5)

## 2018-02-01 ENCOUNTER — Other Ambulatory Visit: Payer: Self-pay | Admitting: Family Medicine

## 2018-02-01 ENCOUNTER — Ambulatory Visit: Payer: Managed Care, Other (non HMO) | Admitting: Family Medicine

## 2018-02-01 ENCOUNTER — Encounter: Payer: Self-pay | Admitting: Family Medicine

## 2018-02-01 VITALS — BP 170/100 | HR 78 | Temp 98.2°F | Wt 216.4 lb

## 2018-02-01 DIAGNOSIS — I10 Essential (primary) hypertension: Secondary | ICD-10-CM

## 2018-02-01 DIAGNOSIS — Z720 Tobacco use: Secondary | ICD-10-CM

## 2018-02-01 DIAGNOSIS — F321 Major depressive disorder, single episode, moderate: Secondary | ICD-10-CM

## 2018-02-01 DIAGNOSIS — Z23 Encounter for immunization: Secondary | ICD-10-CM

## 2018-02-01 DIAGNOSIS — G4700 Insomnia, unspecified: Secondary | ICD-10-CM

## 2018-02-01 MED ORDER — SERTRALINE HCL 100 MG PO TABS: 100.0000 mg | ORAL_TABLET | Freq: Every day | ORAL | 1 refills | Status: DC

## 2018-02-01 MED ORDER — ZOLPIDEM TARTRATE 5 MG PO TABS: 5.0000 mg | ORAL_TABLET | Freq: Every evening | ORAL | 1 refills | Status: DC | PRN

## 2018-02-01 MED ORDER — AMLODIPINE BESYLATE 10 MG PO TABS: 10.0000 mg | ORAL_TABLET | Freq: Every day | ORAL | 1 refills | Status: DC

## 2018-02-01 MED ORDER — LOSARTAN POTASSIUM-HCTZ 100-25 MG PO TABS: ORAL_TABLET | ORAL | 1 refills | Status: DC

## 2018-02-01 NOTE — Patient Instructions (Addendum)
Increase amlodipine to 1.5 tab (7.5mg ) daily until done with rx; then can increase to 10mg  daily.     Discussed negative health consequences of smoking and benefits of smoking cessation with patient in detail. Discussed medical treatment options including patches, gums, zyban, and chantix. Encouraged to let me know if any concerns or if needing help with quitting. 1-800-QUIT-NOW also available to help through quitting process.     DASH Eating Plan DASH stands for "Dietary Approaches to Stop Hypertension." The DASH eating plan is a healthy eating plan that has been shown to reduce high blood pressure (hypertension). It may also reduce your risk for type 2 diabetes, heart disease, and stroke. The DASH eating plan may also help with weight loss. What are tips for following this plan? General guidelines  Avoid eating more than 2,300 mg (milligrams) of salt (sodium) a day. If you have hypertension, you may need to reduce your sodium intake to 1,500 mg a day.  Limit alcohol intake to no more than 1 drink a day for nonpregnant women and 2 drinks a day for men. One drink equals 12 oz of beer, 5 oz of wine, or 1 oz of hard liquor.  Work with your health care provider to maintain a healthy body weight or to lose weight. Ask what an ideal weight is for you.  Get at least 30 minutes of exercise that causes your heart to beat faster (aerobic exercise) most days of the week. Activities may include walking, swimming, or biking.  Work with your health care provider or diet and nutrition specialist (dietitian) to adjust your eating plan to your individual calorie needs. Reading food labels  Check food labels for the amount of sodium per serving. Choose foods with less than 5 percent of the Daily Value of sodium. Generally, foods with less than 300 mg of sodium per serving fit into this eating plan.  To find whole grains, look for the word "whole" as the first word in the ingredient list. Shopping  Buy  products labeled as "low-sodium" or "no salt added."  Buy fresh foods. Avoid canned foods and premade or frozen meals. Cooking  Avoid adding salt when cooking. Use salt-free seasonings or herbs instead of table salt or sea salt. Check with your health care provider or pharmacist before using salt substitutes.  Do not fry foods. Cook foods using healthy methods such as baking, boiling, grilling, and broiling instead.  Cook with heart-healthy oils, such as olive, canola, soybean, or sunflower oil. Meal planning   Eat a balanced diet that includes: ? 5 or more servings of fruits and vegetables each day. At each meal, try to fill half of your plate with fruits and vegetables. ? Up to 6-8 servings of whole grains each day. ? Less than 6 oz of lean meat, poultry, or fish each day. A 3-oz serving of meat is about the same size as a deck of cards. One egg equals 1 oz. ? 2 servings of low-fat dairy each day. ? A serving of nuts, seeds, or beans 5 times each week. ? Heart-healthy fats. Healthy fats called Omega-3 fatty acids are found in foods such as flaxseeds and coldwater fish, like sardines, salmon, and mackerel.  Limit how much you eat of the following: ? Canned or prepackaged foods. ? Food that is high in trans fat, such as fried foods. ? Food that is high in saturated fat, such as fatty meat. ? Sweets, desserts, sugary drinks, and other foods with added sugar. ?  Full-fat dairy products.  Do not salt foods before eating.  Try to eat at least 2 vegetarian meals each week.  Eat more home-cooked food and less restaurant, buffet, and fast food.  When eating at a restaurant, ask that your food be prepared with less salt or no salt, if possible. What foods are recommended? The items listed may not be a complete list. Talk with your dietitian about what dietary choices are best for you. Grains Whole-grain or whole-wheat bread. Whole-grain or whole-wheat pasta. Brown rice. [place].  Bulgur. Whole-grain and low-sodium cereals. Pita bread. Low-fat, low-sodium crackers. Whole-wheat flour tortillas. Vegetables Fresh or frozen vegetables (raw, steamed, roasted, or grilled). Low-sodium or reduced-sodium tomato and vegetable juice. Low-sodium or reduced-sodium tomato sauce and tomato paste. Low-sodium or reduced-sodium canned vegetables. Fruits All fresh, dried, or frozen fruit. Canned fruit in natural juice (without added sugar). Meat and other protein foods Skinless chicken or [place]. Ground chicken or [place]. Pork with fat trimmed off. Fish and seafood. Egg whites. Dried beans, peas, or lentils. Unsalted nuts, nut butters, and seeds. Unsalted canned beans. Lean cuts of beef with fat trimmed off. Low-sodium, lean deli meat. Dairy Low-fat (1%) or fat-free (skim) milk. Fat-free, low-fat, or reduced-fat cheeses. Nonfat, low-sodium ricotta or cottage cheese. Low-fat or nonfat yogurt. Low-fat, low-sodium cheese. Fats and oils Soft margarine without trans fats. Vegetable oil. Low-fat, reduced-fat, or light mayonnaise and salad dressings (reduced-sodium). Canola, safflower, olive, soybean, and sunflower oils. Avocado. Seasoning and other foods Herbs. Spices. Seasoning mixes without salt. Unsalted popcorn and pretzels. Fat-free sweets. What foods are not recommended? The items listed may not be a complete list. Talk with your dietitian about what dietary choices are best for you. Grains Baked goods made with fat, such as croissants, muffins, or some breads. Dry pasta or rice meal packs. Vegetables Creamed or fried vegetables. Vegetables in a cheese sauce. Regular canned vegetables (not low-sodium or reduced-sodium). Regular canned tomato sauce and paste (not low-sodium or reduced-sodium). Regular tomato and vegetable juice (not low-sodium or reduced-sodium). Angie Fava. Olives. Fruits Canned fruit in a light or heavy syrup. Fried fruit. Fruit in cream or butter sauce. Meat and other  protein foods Fatty cuts of meat. Ribs. Fried meat. Berniece Salines. Sausage. Bologna and other processed lunch meats. Salami. Fatback. Hotdogs. Bratwurst. Salted nuts and seeds. Canned beans with added salt. Canned or smoked fish. Whole eggs or egg yolks. Chicken or [place] with skin. Dairy Whole or 2% milk, cream, and half-and-half. Whole or full-fat cream cheese. Whole-fat or sweetened yogurt. Full-fat cheese. Nondairy creamers. Whipped toppings. Processed cheese and cheese spreads. Fats and oils Butter. Stick margarine. Lard. Shortening. Ghee. Bacon fat. Tropical oils, such as coconut, palm kernel, or palm oil. Seasoning and other foods Salted popcorn and pretzels. Onion salt, garlic salt, seasoned salt, table salt, and sea salt. Worcestershire sauce. Tartar sauce. Barbecue sauce. Teriyaki sauce. Soy sauce, including reduced-sodium. Steak sauce. Canned and packaged gravies. Fish sauce. Oyster sauce. Cocktail sauce. Horseradish that you find on the shelf. Ketchup. Mustard. Meat flavorings and tenderizers. Bouillon cubes. Hot sauce and Tabasco sauce. Premade or packaged marinades. Premade or packaged taco seasonings. Relishes. Regular salad dressings. Where to find more information:  National Heart, Lung, and [place]: https://wilson-eaton.com/  American Heart Association: www.heart.org Summary  The DASH eating plan is a healthy eating plan that has been shown to reduce high blood pressure (hypertension). It may also reduce your risk for type 2 diabetes, heart disease, and stroke.  With the DASH eating plan, you  should limit salt (sodium) intake to 2,300 mg a day. If you have hypertension, you may need to reduce your sodium intake to 1,500 mg a day.  When on the DASH eating plan, aim to eat more fresh fruits and vegetables, whole grains, lean proteins, low-fat dairy, and heart-healthy fats.  Work with your health care provider or diet and nutrition specialist (dietitian) to adjust your eating plan to your  individual calorie needs. This information is not intended to replace advice given to you by your health care provider. Make sure you discuss any questions you have with your health care provider. Document Released: 03/11/2011 Document Revised: 03/15/2016 Document Reviewed: 03/15/2016 Elsevier Interactive Patient Education  2018 [employer].  Why is Exercise Important? If I told you I had a single pill that would help you decrease stress by improving anxiety, decreasing depression, help you achieve a healthy weight, give you more energy, make you more productive, help you focus, decrease your risk of dementia/heart attack/stroke/falls, improve your bone health, and more would you be interested? These are just some of the benefits that exercise brings to you. IT IS WORTH carving out some time every day to fit in exercise. It will help in every aspect of your health. Even if you have injuries that prevent you from participating in a type of exercise you used to do; there is always something that you can do to keep exercise a part of your life. If improving your health is important, make exercise your priority. It is worth the time! If you have questions about the type of exercise that is right for you, please talk with me about this!     Exercising to Stay Healthy  Exercising regularly is important. It has many health benefits, such as:  Improving your overall fitness, flexibility, and endurance.  Increasing your bone density.  Helping with weight control.  Decreasing your body fat.  Increasing your muscle strength.  Reducing stress and tension.  Improving your overall health.   In order to become healthy and stay healthy, it is recommended that you do moderate-intensity and vigorous-intensity exercise. You can tell that you are exercising at a moderate intensity if you have a higher heart rate and faster breathing, but you are still able to hold a conversation. You can tell that you are  exercising at a vigorous intensity if you are breathing much harder and faster and cannot hold a conversation while exercising. How often should I exercise? Choose an activity that you enjoy and set realistic goals. Your health care provider can help you to make an activity plan that works for you. Exercise regularly as directed by your health care provider. This may include:  Doing resistance training twice each week, such as: ? Push-ups. ? Sit-ups. ? Lifting weights. ? Using resistance bands.  Doing a given intensity of exercise for a given amount of time. Choose from these options: ? 150 minutes of moderate-intensity exercise every week. ? 75 minutes of vigorous-intensity exercise every week. ? A mix of moderate-intensity and vigorous-intensity exercise every week.   Children, pregnant women, people who are out of shape, people who are overweight, and older adults may need to consult a health care provider for individual recommendations. If you have any sort of medical condition, be sure to consult your health care provider before starting a new exercise program. What are some exercise ideas? Some moderate-intensity exercise ideas include:  Walking at a rate of 1 mile in 15 minutes.  Biking.  Hiking.  Golfing.  Dancing.   Some vigorous-intensity exercise ideas include:  Walking at a rate of at least 4.5 miles per hour.  Jogging or running at a rate of 5 miles per hour.  Biking at a rate of at least 10 miles per hour.  Lap swimming.  Roller-skating or in-line skating.  Cross-country skiing.  Vigorous competitive sports, such as football, basketball, and soccer.  Jumping rope.  Aerobic dancing.   What are some everyday activities that can help me to get exercise?  Yard work, such as: ? Pushing a Surveyor, mining. ? Raking and bagging leaves.  Washing and waxing your car.  Pushing a stroller.  Shoveling snow.  Gardening.  Washing windows or floors. How can I  be more active in my day-to-day activities?  Use the stairs instead of the elevator.  Take a walk during your lunch break.  If you drive, park your car farther away from work or school.  If you take public transportation, get off one stop early and walk the rest of the way.  Make all of your phone calls while standing up and walking around.  Get up, stretch, and walk around every 30 minutes throughout the day. What guidelines should I follow while exercising?  Do not exercise so much that you hurt yourself, feel dizzy, or get very short of breath.  Consult your health care provider before starting a new exercise program.  Wear comfortable clothes and shoes with good support.  Drink plenty of water while you exercise to prevent dehydration or heat stroke. Body water is lost during exercise and must be replaced.  Work out until you breathe faster and your heart beats faster. This information is not intended to replace advice given to you by your health care provider. Make sure you discuss any questions you have with your health care provider.

## 2018-02-01 NOTE — Progress Notes (Signed)
Alexandria Cruz DOB: 12-16-1960 Encounter date: 02/01/2018  This is a 57 y.o. female who presents with Chief Complaint  Patient presents with  . Follow-up    History of present illness: Here to follow up on bloodwork with Dr. Tawanna Cooler  A1C normal at 5.3. This was completed due to slightly elevated sugar at last set of bloodwork. Diet and exercise program was recommended.  Does occasionally check bp at home. Usually 150/80. Not seen lows at home. Has seen systolic over 200 off and on. Is generally pretty good about taking medications daily.   Started with zoloft years ago which has helped keep her a little more level. Has stopped very low lows. Always feeling some stress, anxiety. Work is interesting. Some stress. Much of stress comes from losing child. Lost daughter 5 years ago due to heroin overdose. Bothers her every day. Did try counseling but didn't think that it helped her.   States that bottom line is she needs to get more exercise. States that working long hour days keeps her tired. States that she could possibly do it in the morning.   Does sleep for 4-5 hours with ambien. Has always struggled with sleep; worse after daughter's death.   NO symptoms from high bp at home except occasional headache. No chest pain, chest pressure, heart racing.     Allergies  Allergen Reactions  . Codeine Swelling    Face turns red hand hot  . Minocycline Hcl Swelling    SKIN TURNED BLACK   Current Meds  Medication Sig  . losartan-hydrochlorothiazide (HYZAAR) 100-25 MG tablet TAKE 1 TABLET EACH DAY.  . sertraline (ZOLOFT) 100 MG tablet Take 1 tablet (100 mg total) by mouth daily.  Marland Kitchen zolpidem (AMBIEN) 5 MG tablet Take 1 tablet (5 mg total) by mouth at bedtime as needed for sleep.  . [DISCONTINUED] amLODipine (NORVASC) 5 MG tablet Take 1 tablet (5 mg total) by mouth daily.  . [DISCONTINUED] losartan-hydrochlorothiazide (HYZAAR) 100-25 MG tablet TAKE 1 TABLET EACH DAY.  . [DISCONTINUED]  losartan-hydrochlorothiazide (HYZAAR) 100-25 MG tablet TAKE 1 TABLET EACH DAY.  . [DISCONTINUED] sertraline (ZOLOFT) 100 MG tablet Take 0.5 tablets (50 mg total) by mouth daily.  . [DISCONTINUED] zolpidem (AMBIEN) 5 MG tablet TAKE 1 TABLET BY MOUTH AT BEDTIME IF NEEDED FOR SLEEP.  . [DISCONTINUED] zolpidem (AMBIEN) 5 MG tablet TAKE 1 TABLET BY MOUTH AT BEDTIME IF NEEDED FOR SLEEP.    Review of Systems  Constitutional: Negative for chills, fatigue and fever.  Respiratory: Negative for cough, chest tightness, shortness of breath and wheezing.   Cardiovascular: Negative for chest pain, palpitations and leg swelling.  Psychiatric/Behavioral: Positive for sleep disturbance. Negative for decreased concentration and suicidal ideas. The patient is nervous/anxious.     Objective:  BP (!) 170/100 (BP Location: Left Arm, Patient Position: Sitting, Cuff Size: Normal)   Pulse 78   Temp 98.2 F (36.8 C) (Oral)   Wt 216 lb 6.4 oz (98.2 kg)   LMP 10/28/2010   SpO2 98%   BMI 34.93 kg/m   Weight: 216 lb 6.4 oz (98.2 kg)   BP Readings from Last 3 Encounters:  02/01/18 (!) 170/100  06/28/17 (!) 148/100  12/14/16 (!) 158/106   Wt Readings from Last 3 Encounters:  02/01/18 216 lb 6.4 oz (98.2 kg)  06/28/17 211 lb (95.7 kg)  12/14/16 203 lb (92.1 kg)    Physical Exam  Constitutional: She is oriented to person, place, and time. She appears well-developed and well-nourished. No distress.  Cardiovascular: Normal rate, regular rhythm and normal heart sounds. Exam reveals no friction rub.  No murmur heard. No lower extremity edema  Pulmonary/Chest: Effort normal and breath sounds normal. No respiratory distress. She has no wheezes. She has no rales.  Neurological: She is alert and oriented to person, place, and time.  Psychiatric: She has a normal mood and affect. Her behavior is normal. Thought content normal. Cognition and memory are normal.    Assessment/Plan  1. Depression, major, single  episode, moderate (HCC) Control has been decent, but still with significant stress/anxiety so will trial increase in the zoloft to try and get better control.  - sertraline (ZOLOFT) 100 MG tablet; Take 1 tablet (100 mg total) by mouth daily.  Dispense: 90 tablet; Refill: 1  2. Hypertension, unspecified type Suboptimal control. Increase norvasc to 10mg  daily. - amLODipine (NORVASC) 10 MG tablet; Take 1 tablet (10 mg total) by mouth daily.  Dispense: 90 tablet; Refill: 1 - losartan-hydrochlorothiazide (HYZAAR) 100-25 MG tablet; TAKE 1 TABLET EACH DAY.  Dispense: 90 tablet; Refill: 1  3. Insomnia, unspecified type Ongoing. Ambien does control symptoms.  - zolpidem (AMBIEN) 5 MG tablet; Take 1 tablet (5 mg total) by mouth at bedtime as needed for sleep.  Dispense: 90 tablet; Refill: 1  4. Tobacco abuse: not interested in quitting at this time. Has quit cold [place] in past and quit for 12 years. Knows benefits (which we reviewed today).    Return in about 3 months (around 05/04/2018) for Chronic condition visit.  Flu shot given today.   Theodis Shove, MD

## 2018-02-03 NOTE — Telephone Encounter (Signed)
Copied from CRM [id number]. Topic: Quick Communication - See Telephone Encounter >> Feb 03, 2018  8:50 AM Jolayne Haines L wrote: CRM for notification. See Telephone encounter for: 02/03/18.  [facility] states they did not get a script for zolpidem (AMBIEN) 5 MG tablet. It states it was phoned-in but they state they did not receive it. Please Advise.

## 2018-02-16 ENCOUNTER — Other Ambulatory Visit: Payer: Self-pay

## 2018-02-16 MED ORDER — HYDROCHLOROTHIAZIDE 25 MG PO TABS: 25.0000 mg | ORAL_TABLET | Freq: Every day | ORAL | 3 refills | Status: DC

## 2018-02-16 MED ORDER — LOSARTAN POTASSIUM 100 MG PO TABS: 100.0000 mg | ORAL_TABLET | Freq: Every day | ORAL | 3 refills | Status: DC

## 2018-02-24 ENCOUNTER — Encounter: Payer: Self-pay | Admitting: Family Medicine

## 2018-04-26 ENCOUNTER — Ambulatory Visit: Payer: Self-pay | Admitting: Family Medicine

## 2018-06-16 ENCOUNTER — Ambulatory Visit: Payer: BLUE CROSS/BLUE SHIELD | Admitting: Family Medicine

## 2018-06-16 ENCOUNTER — Other Ambulatory Visit: Payer: Self-pay

## 2018-06-16 ENCOUNTER — Encounter: Payer: Self-pay | Admitting: Family Medicine

## 2018-06-16 VITALS — BP 150/90 | HR 89 | Temp 98.2°F | Ht 66.0 in | Wt 213.5 lb

## 2018-06-16 DIAGNOSIS — I10 Essential (primary) hypertension: Secondary | ICD-10-CM | POA: Diagnosis not present

## 2018-06-16 DIAGNOSIS — F3341 Major depressive disorder, recurrent, in partial remission: Secondary | ICD-10-CM

## 2018-06-16 DIAGNOSIS — H6123 Impacted cerumen, bilateral: Secondary | ICD-10-CM

## 2018-06-16 DIAGNOSIS — T7840XD Allergy, unspecified, subsequent encounter: Secondary | ICD-10-CM | POA: Diagnosis not present

## 2018-06-16 DIAGNOSIS — G4700 Insomnia, unspecified: Secondary | ICD-10-CM | POA: Diagnosis not present

## 2018-06-16 DIAGNOSIS — E782 Mixed hyperlipidemia: Secondary | ICD-10-CM | POA: Diagnosis not present

## 2018-06-16 DIAGNOSIS — Z1239 Encounter for other screening for malignant neoplasm of breast: Secondary | ICD-10-CM

## 2018-06-16 MED ORDER — HYDROCHLOROTHIAZIDE 50 MG PO TABS: 50.0000 mg | ORAL_TABLET | Freq: Every day | ORAL | 1 refills | Status: DC

## 2018-06-16 MED ORDER — LOSARTAN POTASSIUM 100 MG PO TABS: 100.0000 mg | ORAL_TABLET | Freq: Every day | ORAL | 3 refills | Status: DC

## 2018-06-16 MED ORDER — ZOLPIDEM TARTRATE 10 MG PO TABS: 5.0000 mg | ORAL_TABLET | Freq: Every evening | ORAL | 5 refills | Status: DC | PRN

## 2018-06-16 NOTE — Progress Notes (Signed)
Alexandria Cruz DOB: 07/18/1960 Encounter date: 06/16/2018  This is a 58 y.o. female who presents to establish care. Chief Complaint  Patient presents with  . Transitions Of Care    History of present illness: Right ear is getting stopped up for last couple of weeks. When she pulls on it then she gets some relief.   HTN: on amlodipine 10mg , hctz 25mg , losartan 100mg . Sporadically will check at home. Usually similar to 150's and bottom number usually lower.  HL: never been on medications for cholesterol.  Depression:on zoloft 100mg :feels that mood is doing really well.  Insomnia:does very well with the Remus Loffler and is able to sleep with this.  Allergies:does well with benadryl; just uses prn Doesn't have desire to quit smoking. Knows she should.   Colonoscopy, mamm Last lipids last march; will repeat today  Past Medical History:  Diagnosis Date  . Depression   . Endometriosis   . Hypertension   . Smoker    1/2 PPD   Past Surgical History:  Procedure Laterality Date  . CESAREAN SECTION     X3  . PELVIC LAPAROSCOPY  2000   ENDOMETRIOSIS  . TUBAL LIGATION     Allergies  Allergen Reactions  . Codeine Swelling    Face turns red hand hot  . Minocycline Hcl Swelling    SKIN TURNED BLACK   Current Meds  Medication Sig  . amLODipine (NORVASC) 10 MG tablet Take 1 tablet (10 mg total) by mouth daily.  Marland Kitchen losartan (COZAAR) 100 MG tablet Take 1 tablet (100 mg total) by mouth daily.  . sertraline (ZOLOFT) 100 MG tablet Take 1 tablet (100 mg total) by mouth daily.  . [DISCONTINUED] hydrochlorothiazide (HYDRODIURIL) 25 MG tablet Take 1 tablet (25 mg total) by mouth daily.  . [DISCONTINUED] losartan (COZAAR) 100 MG tablet Take 1 tablet (100 mg total) by mouth daily.  . [DISCONTINUED] losartan-hydrochlorothiazide (HYZAAR) 100-25 MG tablet TAKE 1 TABLET EACH DAY.  . [DISCONTINUED] zolpidem (AMBIEN) 5 MG tablet Take 1 tablet (5 mg total) by mouth at bedtime as needed for sleep.    Social History   Tobacco Use  . Smoking status: Current Every Day Smoker    Packs/day: 0.50    Types: Cigarettes  . Smokeless tobacco: Never Used  Substance Use Topics  . Alcohol use: No   Family History  Problem Relation Age of Onset  . Hypertension Mother   . Cancer Mother        COLON  . Cancer Maternal Grandmother        OVARIAN     Review of Systems  Constitutional: Negative for chills, fatigue and fever.  Respiratory: Negative for cough, chest tightness, shortness of breath and wheezing.   Cardiovascular: Negative for chest pain, palpitations and leg swelling.    Objective:  BP (!) 150/90 (BP Location: Left Arm, Patient Position: Sitting, Cuff Size: Normal)   Pulse 89   Temp 98.2 F (36.8 C) (Oral)   Ht 5\' 6"  (1.676 m)   Wt 213 lb 8 oz (96.8 kg)   LMP 10/28/2010   SpO2 99%   BMI 34.46 kg/m   Weight: 213 lb 8 oz (96.8 kg)   BP Readings from Last 3 Encounters:  06/16/18 (!) 150/90  02/01/18 (!) 170/100  06/28/17 (!) 148/100   Wt Readings from Last 3 Encounters:  06/16/18 213 lb 8 oz (96.8 kg)  02/01/18 216 lb 6.4 oz (98.2 kg)  06/28/17 211 lb (95.7 kg)    Physical Exam Constitutional:  General: She is not in acute distress.    Appearance: She is well-developed.  Cardiovascular:     Rate and Rhythm: Normal rate and regular rhythm.     Heart sounds: Normal heart sounds. No murmur. No friction rub.  Pulmonary:     Effort: Pulmonary effort is normal. No respiratory distress.     Breath sounds: Normal breath sounds. No wheezing or rales.  Musculoskeletal:     Right lower leg: No edema.     Left lower leg: No edema.  Neurological:     Mental Status: She is alert and oriented to person, place, and time.  Psychiatric:        Behavior: Behavior normal.    Depression screen [facility] 2/9 06/16/2018 02/01/2018  Decreased Interest 0 0  Down, Depressed, Hopeless 1 1  PHQ - 2 Score 1 1  Altered sleeping 0 3  Tired, decreased energy 0 1  Change in  appetite 0 0  Feeling bad or failure about yourself  1 3  Trouble concentrating 0 0  Moving slowly or fidgety/restless 0 0  Suicidal thoughts 0 0  PHQ-9 Score 2 8  Difficult doing work/chores Not difficult at all Not difficult at all    Assessment/Plan: 1. Essential hypertension Increase hydrochlorothiazide to 50 mg daily.  Continue other medications. - Comprehensive metabolic panel; Future - CBC with Differential/Platelet; Future - hydrochlorothiazide (HYDRODIURIL) 50 MG tablet; Take 1 tablet (50 mg total) by mouth daily.  Dispense: 90 tablet; Refill: 1 - losartan (COZAAR) 100 MG tablet; Take 1 tablet (100 mg total) by mouth daily.  Dispense: 90 tablet; Refill: 3  2. Allergic state, subsequent encounter Over-the-counter allergy medications as needed.  3. Hyperlipidemia, mixed - Lipid panel; Future - TSH; Future  4. Insomnia, unspecified type - zolpidem (AMBIEN) 10 MG tablet; Take 0.5-1 tablets (5-10 mg total) by mouth at bedtime as needed for up to 30 days for sleep.  Dispense: 30 tablet; Refill: 5  5. Depression, major, recurrent, in partial remission (HCC) Stable.  Continue current medication at current dose.  6. Screening for breast cancer - [facility] DIGITAL SCREENING BILATERAL; Future  7. Bilateral impacted cerumen Unable to resolve in office; use wax softening drops at home and return prn for lavage - Ear Lavage  Return in about 6 months (around 12/17/2018) for physical exam.  Theodis Shove, MD

## 2018-06-16 NOTE — Patient Instructions (Signed)
[facility]: call to schedule colonoscopy- [phone]

## 2018-09-15 ENCOUNTER — Other Ambulatory Visit: Payer: Self-pay

## 2018-09-15 ENCOUNTER — Ambulatory Visit
Admission: RE | Admit: 2018-09-15 | Discharge: 2018-09-15 | Disposition: A | Discharge status: Home / Self Care | Payer: BC Managed Care – PPO | Source: Ambulatory Visit | Attending: Family Medicine | Admitting: Family Medicine

## 2018-09-15 DIAGNOSIS — Z1231 Encounter for screening mammogram for malignant neoplasm of breast: Secondary | ICD-10-CM | POA: Diagnosis not present

## 2018-09-15 DIAGNOSIS — Z1239 Encounter for other screening for malignant neoplasm of breast: Secondary | ICD-10-CM

## 2018-09-18 ENCOUNTER — Telehealth: Payer: Self-pay | Admitting: *Deleted

## 2018-09-18 ENCOUNTER — Other Ambulatory Visit: Payer: Self-pay

## 2018-09-18 ENCOUNTER — Ambulatory Visit (INDEPENDENT_AMBULATORY_CARE_PROVIDER_SITE_OTHER): Payer: BC Managed Care – PPO | Admitting: Family Medicine

## 2018-09-18 DIAGNOSIS — E782 Mixed hyperlipidemia: Secondary | ICD-10-CM

## 2018-09-18 DIAGNOSIS — I10 Essential (primary) hypertension: Secondary | ICD-10-CM

## 2018-09-18 DIAGNOSIS — F3341 Major depressive disorder, recurrent, in partial remission: Secondary | ICD-10-CM | POA: Diagnosis not present

## 2018-09-18 DIAGNOSIS — G4700 Insomnia, unspecified: Secondary | ICD-10-CM | POA: Diagnosis not present

## 2018-09-18 NOTE — Telephone Encounter (Signed)
I left a message for the pt to return my call. 

## 2018-09-18 NOTE — Progress Notes (Signed)
Virtual Visit via Video Note  I connected with Alexandria Cruz  on 09/18/18 at  8:00 AM EDT by a video enabled telemedicine application and verified that I am speaking with the correct person using two identifiers.  Location patient: home Location provider:work or home office Persons participating in the virtual visit: patient, provider  I discussed the limitations of evaluation and management by telemedicine and the availability of in person appointments. The patient expressed understanding and agreed to proceed.   Alexandria PortsChristine V Cruz DOB: 08/30/1960 Encounter date: 09/18/2018  This is a 58 y.o. female who presents with No chief complaint on file.   History of present illness: Last visit in march; reported bp in the 150's systolic range. HCTZ increased to 50mg , continued losartan 100mg , amlodopine 10mg . (due for bloodwork - order in system from last visit)  Has been enjoying working from home. Has not been checking bp at home; but seems to have been fine because nothing is stressing her out. Not going anywhere. Unable to check bp today due to machine not working. She will work on getting this back up in order and will message me blood pressures through mychart.  Still using ambien for sleep. Mood has been doing well overall; stress level is much better.   Diet has been better - eating less at home. Was walking regularly, but cut back. Plans to get restarted with this.  Allergies  Allergen Reactions  . Codeine Swelling    Face turns red hand hot  . Minocycline Hcl Swelling    SKIN TURNED BLACK   No outpatient medications have been marked as taking for the 09/18/18 encounter (Office Visit) with Wynn BankerKoberlein,  C, MD.    Review of Systems  Constitutional: Negative for chills, fatigue and fever.  Respiratory: Negative for cough, chest tightness, shortness of breath and wheezing.   Cardiovascular: Negative for chest pain, palpitations and leg swelling.    Objective:  LMP  10/28/2010       BP Readings from Last 3 Encounters:  06/16/18 (!) 150/90  02/01/18 (!) 170/100  06/28/17 (!) 148/100   Wt Readings from Last 3 Encounters:  06/16/18 213 lb 8 oz (96.8 kg)  02/01/18 216 lb 6.4 oz (98.2 kg)  06/28/17 211 lb (95.7 kg)    EXAM:  GENERAL: alert, oriented, appears well and in no acute distress  HEENT: atraumatic, conjunctiva clear, no obvious abnormalities on inspection of external nose and ears  NECK: normal movements of the head and neck  LUNGS: on inspection no signs of respiratory distress, breathing rate appears normal, no obvious gross SOB, gasping or wheezing  CV: no obvious cyanosis  MS: moves all visible extremities without noticeable abnormality  PSYCH/NEURO: pleasant and cooperative, no obvious depression or anxiety, speech and thought processing grossly intact   Assessment/Plan  1. Essential hypertension Continue current medications. Home checking to help us keep her at bp goal. She [place] [facility] [place]. BLoodwork ordered in march to be scheduled in next month.   2. Hyperlipidemia, mixed Eating healthier at home. Repeat bloodwork soon.  3. Insomnia, unspecified type Remus Lofflerambien still works well for her. Continue prn.  4. Depression, major, recurrent, in partial remission (HCC) Stable. Continue zoloft.   Return in about 6 months (around 03/20/2019) for physical exam.    I discussed the assessment and treatment plan with the patient. The patient was provided an opportunity to ask questions and all were answered. The patient agreed with the plan and demonstrated an  understanding of the instructions.   The patient was advised to call back or seek an in-person evaluation if the symptoms worsen or if the condition fails to improve as anticipated.  I provided 18 minutes of non-face-to-face time during this encounter.   Micheline Rough, MD

## 2018-09-18 NOTE — Telephone Encounter (Signed)
-----   Message from Caren Macadam, MD sent at 09/18/2018  8:16 AM EDT ----- Please schedule lab visit for her. Labs were ordered back in march. If she wants she can schedule physical in December as well. We can add visit if needed based on bloodwork results.

## 2018-09-18 NOTE — Telephone Encounter (Signed)
I called the pt and scheduled appts as below. 

## 2018-09-18 NOTE — Telephone Encounter (Signed)
-----   Message from Junell C Koberlein, MD sent at 09/18/2018  8:16 AM EDT ----- Please schedule lab visit for her. Labs were ordered back in march. If she wants she can schedule physical in December as well. We can add visit if needed based on bloodwork results. 

## 2018-09-27 ENCOUNTER — Other Ambulatory Visit: Payer: BC Managed Care – PPO

## 2018-12-05 ENCOUNTER — Other Ambulatory Visit: Payer: Self-pay | Admitting: Family Medicine

## 2018-12-05 DIAGNOSIS — G4700 Insomnia, unspecified: Secondary | ICD-10-CM

## 2018-12-05 MED ORDER — ZOLPIDEM TARTRATE 10 MG PO TABS: 5.0000 mg | ORAL_TABLET | Freq: Every evening | ORAL | 5 refills | Status: DC | PRN

## 2018-12-05 NOTE — Telephone Encounter (Signed)
Please see rx refill request  °

## 2019-03-23 ENCOUNTER — Encounter: Payer: BC Managed Care – PPO | Admitting: Family Medicine

## 2019-04-03 ENCOUNTER — Other Ambulatory Visit: Payer: Self-pay | Admitting: Family Medicine

## 2019-04-03 DIAGNOSIS — I10 Essential (primary) hypertension: Secondary | ICD-10-CM

## 2019-04-03 MED ORDER — AMLODIPINE BESYLATE 10 MG PO TABS: 10.0000 mg | ORAL_TABLET | Freq: Every day | ORAL | 1 refills | Status: DC

## 2019-04-03 NOTE — Telephone Encounter (Signed)
Medication Refill - Medication:  amLODipine (NORVASC) 10 MG tablet  Has the patient contacted their pharmacy? Yes advised to call office. Pharmacy has sent requests with no response.   Preferred Pharmacy (with phone number or street name):  [facility] [place], [place],  [id number]  Agent: Please be advised that RX refills may take up to 3 business days. We ask that you follow-up with your pharmacy.

## 2019-04-03 NOTE — Telephone Encounter (Signed)
Requested medication (s) are due for refill today: yes  Requested medication (s) are on the active medication list: yes   Future visit scheduled: yes  Notes to clinic:  no valid encounter within last 6 months  Requested Prescriptions  Pending Prescriptions Disp Refills   amLODipine (NORVASC) 10 MG tablet 90 tablet 1    Sig: Take 1 tablet (10 mg total) by mouth daily.      Cardiovascular:  Calcium Channel Blockers Failed - 04/03/2019  9:26 AM      Failed - Last BP in normal range    BP Readings from Last 1 Encounters:  06/16/18 (!) 150/90          Failed - Valid encounter within last 6 months    Recent Outpatient Visits           6 months ago Essential hypertension   Therapist, [facility] at [employer], Steele Berg, MD   9 months ago Essential hypertension   Therapist, music at [employer], Steele Berg, MD   1 year ago Depression, major, single episode, moderate ([place])   Therapist, music at [employer], Steele Berg, MD   1 year ago Hyperlipidemia, unspecified hyperlipidemia type   Therapist, music at [employer], Jory Ee, MD   2 years ago Need for prophylactic vaccination and inoculation against influenza   Therapist, music at [employer], Jory Ee, MD       Future Appointments             In 2 months Koberlein, Steele Berg, MD [employer] [facility] [place], [facility]

## 2019-04-05 ENCOUNTER — Other Ambulatory Visit: Payer: Self-pay | Admitting: *Deleted

## 2019-04-05 DIAGNOSIS — I10 Essential (primary) hypertension: Secondary | ICD-10-CM

## 2019-04-05 MED ORDER — HYDROCHLOROTHIAZIDE 50 MG PO TABS: 50.0000 mg | ORAL_TABLET | Freq: Every day | ORAL | 1 refills | Status: DC

## 2019-04-05 NOTE — Telephone Encounter (Signed)
Rx done. 

## 2019-05-22 ENCOUNTER — Other Ambulatory Visit: Payer: Self-pay | Admitting: Family Medicine

## 2019-05-22 DIAGNOSIS — G4700 Insomnia, unspecified: Secondary | ICD-10-CM

## 2019-06-12 ENCOUNTER — Other Ambulatory Visit: Payer: Self-pay

## 2019-06-13 ENCOUNTER — Encounter: Payer: Self-pay | Admitting: Family Medicine

## 2019-06-13 ENCOUNTER — Ambulatory Visit (INDEPENDENT_AMBULATORY_CARE_PROVIDER_SITE_OTHER): Payer: BC Managed Care – PPO | Admitting: Family Medicine

## 2019-06-13 VITALS — BP 160/90 | HR 83 | Temp 97.4°F | Ht 67.25 in | Wt 211.3 lb

## 2019-06-13 DIAGNOSIS — F17200 Nicotine dependence, unspecified, uncomplicated: Secondary | ICD-10-CM | POA: Diagnosis not present

## 2019-06-13 DIAGNOSIS — Z0000 Encounter for general adult medical examination without abnormal findings: Secondary | ICD-10-CM | POA: Diagnosis not present

## 2019-06-13 DIAGNOSIS — G4700 Insomnia, unspecified: Secondary | ICD-10-CM

## 2019-06-13 DIAGNOSIS — I10 Essential (primary) hypertension: Secondary | ICD-10-CM

## 2019-06-13 DIAGNOSIS — Z800 Family history of malignant neoplasm of digestive organs: Secondary | ICD-10-CM

## 2019-06-13 DIAGNOSIS — L309 Dermatitis, unspecified: Secondary | ICD-10-CM

## 2019-06-13 DIAGNOSIS — E782 Mixed hyperlipidemia: Secondary | ICD-10-CM | POA: Diagnosis not present

## 2019-06-13 DIAGNOSIS — F3341 Major depressive disorder, recurrent, in partial remission: Secondary | ICD-10-CM

## 2019-06-13 LAB — TSH: TSH: 1.49 u[IU]/mL (ref 0.35–4.50)

## 2019-06-13 LAB — CBC WITH DIFFERENTIAL/PLATELET
Basophils Absolute: 0.1 10*3/uL (ref 0.0–0.1)
Basophils Relative: 0.7 % (ref 0.0–3.0)
Eosinophils Absolute: 0.3 10*3/uL (ref 0.0–0.7)
Eosinophils Relative: 3.7 % (ref 0.0–5.0)
HCT: 48.6 % — ABNORMAL HIGH (ref 36.0–46.0)
Hemoglobin: 16.9 g/dL — ABNORMAL HIGH (ref 12.0–15.0)
Lymphocytes Relative: 20.9 % (ref 12.0–46.0)
Lymphs Abs: 1.7 10*3/uL (ref 0.7–4.0)
MCHC: 34.7 g/dL (ref 30.0–36.0)
MCV: 98.9 fl (ref 78.0–100.0)
Monocytes Absolute: 0.6 10*3/uL (ref 0.1–1.0)
Monocytes Relative: 7.7 % (ref 3.0–12.0)
Neutro Abs: 5.5 10*3/uL (ref 1.4–7.7)
Neutrophils Relative %: 67 % (ref 43.0–77.0)
Platelets: 364 10*3/uL (ref 150.0–400.0)
RBC: 4.92 Mil/uL (ref 3.87–5.11)
RDW: 13 % (ref 11.5–15.5)
WBC: 8.3 10*3/uL (ref 4.0–10.5)

## 2019-06-13 LAB — COMPREHENSIVE METABOLIC PANEL
ALT: 31 U/L (ref 0–35)
AST: 20 U/L (ref 0–37)
Albumin: 4.4 g/dL (ref 3.5–5.2)
Alkaline Phosphatase: 103 U/L (ref 39–117)
BUN: 15 mg/dL (ref 6–23)
CO2: 30 mEq/L (ref 19–32)
Calcium: 9.8 mg/dL (ref 8.4–10.5)
Chloride: 97 mEq/L (ref 96–112)
Creatinine, Ser: 0.83 mg/dL (ref 0.40–1.20)
GFR: 70.32 mL/min (ref >60.00)
Glucose, Bld: 94 mg/dL (ref 70–99)
Potassium: 4.4 mEq/L (ref 3.5–5.1)
Sodium: 136 mEq/L (ref 135–145)
Total Bilirubin: 0.7 mg/dL (ref 0.2–1.2)
Total Protein: 6.9 g/dL (ref 6.0–8.3)

## 2019-06-13 LAB — LIPID PANEL
Cholesterol: 212 mg/dL — ABNORMAL HIGH (ref 0–200)
HDL: 72.2 mg/dL (ref >39.00)
LDL Cholesterol: 112 mg/dL — ABNORMAL HIGH (ref 0–99)
NonHDL: 140.02
Total CHOL/HDL Ratio: 3
Triglycerides: 140 mg/dL (ref 0.0–149.0)
VLDL: 28 mg/dL (ref 0.0–40.0)

## 2019-06-13 MED ORDER — BUPROPION HCL ER (SR) 150 MG PO TB12: 150.0000 mg | ORAL_TABLET | Freq: Two times a day (BID) | ORAL | 2 refills | Status: DC

## 2019-06-13 MED ORDER — AMLODIPINE-VALSARTAN-HCTZ 10-320-25 MG PO TABS: 1.0000 | ORAL_TABLET | Freq: Every day | ORAL | 1 refills | Status: DC

## 2019-06-13 NOTE — Progress Notes (Addendum)
Alexandria Cruz DOB: 03/07/1961 Encounter date: 06/13/2019  This is a 59 y.o. female who presents for complete physical   History of present illness/Additional concerns: Last year of working from home has been nice for her. Better work life balance.   HTN: hctz 50mg , losartan 100mg , amlodipine 10mg . Has not been checking her pressures at home. Has been much higher at home 190/110.   Insomnia: ambien. Sleeping is fine as long as she has . Doesn't sleep well without it. Gets solid 3-4 hours and then lighter sleep. Feels rested when she gets up.   Depression/anxiety: zoloft started after loss of child. OK overall.   Cerumen impaction in past: not having any issues with hearing/discomfort.   Last pap was 06/2017 and was normal. Last mammogram 09/2018 normal Normal colonoscopy 01/2007  Hasn't seen dermatology in awhile.  Husband monitors skin changes.  Past Medical History:  Diagnosis Date  . Depression   . Endometriosis   . Hypertension   . Smoker    1/2 PPD   Past Surgical History:  Procedure Laterality Date  . CESAREAN SECTION     X3  . PELVIC LAPAROSCOPY  2000   ENDOMETRIOSIS  . TUBAL LIGATION     Allergies  Allergen Reactions  . Codeine Swelling    Face turns red hand hot  . Minocycline Hcl Swelling    SKIN TURNED BLACK   Current Meds  Medication Sig  . sertraline (ZOLOFT) 100 MG tablet Take 1 tablet (100 mg total) by mouth daily.  07/2017 zolpidem (AMBIEN) 10 MG tablet TAKE 1/2 TO 1 TABLET BY MOUTH EVERY DAY AT BEDTIME AS NEEDED FOR SLEEP FOR UP TO 30 DAYS  . [DISCONTINUED] amLODipine (NORVASC) 10 MG tablet Take 1 tablet (10 mg total) by mouth daily.  . [DISCONTINUED] hydrochlorothiazide (HYDRODIURIL) 50 MG tablet Take 1 tablet (50 mg total) by mouth daily.  . [DISCONTINUED] losartan (COZAAR) 100 MG tablet Take 1 tablet (100 mg total) by mouth daily.   Social History   Tobacco Use  . Smoking status: Current Every Day Smoker    Packs/day: 0.50    Types:  Cigarettes  . Smokeless tobacco: Never Used  Substance Use Topics  . Alcohol use: No   Family History  Problem Relation Age of Onset  . Hypertension Mother   . Cancer Mother 69       COLON  . Cancer Maternal Grandmother        OVARIAN     Review of Systems  Constitutional: Negative for activity change, appetite change, chills, fatigue, fever and unexpected weight change.  HENT: Negative for congestion, ear pain, hearing loss, sinus pressure, sinus pain, sore throat and trouble swallowing.   Eyes: Negative for pain and visual disturbance.  Respiratory: Negative for cough, chest tightness, shortness of breath and wheezing.   Cardiovascular: Negative for chest pain, palpitations and leg swelling.  Gastrointestinal: Negative for abdominal pain, blood in stool, constipation, diarrhea, nausea and vomiting.  Genitourinary: Negative for difficulty urinating and menstrual problem.  Musculoskeletal: Negative for arthralgias and back pain.  Skin: Negative for rash.  Neurological: Negative for dizziness, weakness, numbness and headaches.  Hematological: Negative for adenopathy. Does not bruise/bleed easily.  Psychiatric/Behavioral: Negative for sleep disturbance and suicidal ideas. The patient is not nervous/anxious.     CBC:  Lab Results  Component Value Date   WBC 7.6 01/27/2018   HGB 16.6 (H) 01/27/2018   HCT 48.1 (H) 01/27/2018   MCH 32.2 08/22/2015   MCHC 34.5 01/27/2018  RDW 13.0 01/27/2018   PLT 353.0 01/27/2018   MPV 9.1 08/22/2015   CMP: Lab Results  Component Value Date   NA 136 06/28/2017   K 4.7 06/28/2017   CL 100 06/28/2017   CO2 28 06/28/2017   GLUCOSE 111 (H) 06/28/2017   BUN 13 06/28/2017   CREATININE 0.81 06/28/2017   CREATININE 0.93 08/22/2015   CALCIUM 9.5 06/28/2017   PROT 6.7 06/28/2017   BILITOT 0.6 06/28/2017   ALKPHOS 100 06/28/2017   ALT 31 06/28/2017   AST 19 06/28/2017   LIPID: Lab Results  Component Value Date   CHOL 227 (H) 06/28/2017    TRIG 167.0 (H) 06/28/2017   HDL 69.10 06/28/2017   LDLCALC 124 (H) 06/28/2017    Objective:  BP (!) 160/98 (BP Location: Left Arm, Patient Position: Sitting, Cuff Size: Large)   Pulse 83   Temp (!) 97.4 F (36.3 C) (Temporal)   Ht 5' 7.25" (1.708 m)   Wt 211 lb 4.8 oz (95.8 kg)   LMP 10/28/2010   SpO2 98%   BMI 32.85 kg/m   Weight: 211 lb 4.8 oz (95.8 kg)   BP Readings from Last 3 Encounters:  06/13/19 (!) 160/98  06/16/18 (!) 150/90  02/01/18 (!) 170/100   Wt Readings from Last 3 Encounters:  06/13/19 211 lb 4.8 oz (95.8 kg)  06/16/18 213 lb 8 oz (96.8 kg)  02/01/18 216 lb 6.4 oz (98.2 kg)    Physical Exam Constitutional:      General: She is not in acute distress.    Appearance: She is well-developed.  HENT:     Head: Normocephalic and atraumatic.     Right Ear: External ear normal.     Left Ear: External ear normal.     Mouth/Throat:     Pharynx: No oropharyngeal exudate.  Eyes:     Conjunctiva/sclera: Conjunctivae normal.     Pupils: Pupils are equal, round, and reactive to light.  Neck:     Thyroid: No thyromegaly.  Cardiovascular:     Rate and Rhythm: Normal rate and regular rhythm.     Heart sounds: Normal heart sounds. No murmur. No friction rub. No gallop.   Pulmonary:     Effort: Pulmonary effort is normal.     Breath sounds: Normal breath sounds.  Abdominal:     General: Bowel sounds are normal. There is no distension.     Palpations: Abdomen is soft. There is no mass.     Tenderness: There is no abdominal tenderness. There is no guarding.     Hernia: No hernia is present.  Musculoskeletal:        General: No tenderness or deformity. Normal range of motion.     Cervical back: Normal range of motion and neck supple.  Lymphadenopathy:     Cervical: No cervical adenopathy.     Upper Body:     Right upper body: No supraclavicular, axillary or pectoral adenopathy.     Left upper body: No supraclavicular, axillary or pectoral adenopathy.  Skin:     General: Skin is warm and dry.     Findings: No rash.     Comments: scaling bilat 2nd toes on tip. Appears consistent with irritation/dyshydrotic eczema.  Neurological:     Mental Status: She is alert and oriented to person, place, and time.     Deep Tendon Reflexes: Reflexes normal.     Reflex Scores:      Tricep reflexes are 2+ on the right side and  2+ on the left side.      Bicep reflexes are 2+ on the right side and 2+ on the left side.      Brachioradialis reflexes are 2+ on the right side and 2+ on the left side.      Patellar reflexes are 2+ on the right side and 2+ on the left side. Psychiatric:        Speech: Speech normal.        Behavior: Behavior normal.        Thought Content: Thought content normal.     Assessment/Plan: Health Maintenance Due  Topic Date Due  . Hepatitis C Screening  January 15, 1961  . INFLUENZA VACCINE  11/04/2018   Health Maintenance reviewed - referral placed for colonoscopy.  Patient recommended to follow-up with dentist.   Counter.  1. Preventative health care Keep up with regular walking.  Continue to work on healthy eating.  2. Family history of colon cancer in mother - Ambulatory referral to Gastroenterology  3. Tobacco dependence She is interested in quitting smoking.  She is down to half a pack a day.Discussed new medication(s) today with patient. Discussed potential side effects and patient verbalized understanding.  - buPROPion (WELLBUTRIN SR) 150 MG 12 hr tablet; Take 1 tablet (150 mg total) by mouth 2 (two) times daily. Take 1 tablet daily x 7 days, then increase to twice daily  Dispense: 60 tablet; Refill: 2  4. Essential hypertension Blood pressures not well controlled.  Switch her over to a single pill and consider adding on spironolactone pending home report and lab work. (working to change over meds and not cause pill burden) - amLODIPine-Valsartan-HCTZ 10-320-25 MG TABS; Take 1 tablet by mouth daily.  Dispense: 90 tablet;  Refill: 1 - CBC with Differential/Platelet - Comprehensive metabolic panel  5. Hyperlipidemia, mixed - TSH - Lipid panel  6. Insomnia, unspecified type Ambien works well to help with sleep.  7. Depression, major, recurrent, in partial remission (HCC) Mood has been very stable.  Continue Zoloft.  Return for pending lab results.  Theodis Shove, MD

## 2019-06-13 NOTE — Patient Instructions (Signed)
.   1-800-QUIT-NOW also available to help through quitting process with smoking and will offer free tools (nicotine replacement, etc)  Please check your blood pressures at home. I will check back in with you once I get bloodwork results.

## 2019-06-14 MED ORDER — TRIAMCINOLONE ACETONIDE 0.1 % EX CREA: 1.0000 "application " | TOPICAL_CREAM | Freq: Two times a day (BID) | CUTANEOUS | 0 refills | Status: DC

## 2019-06-14 NOTE — Addendum Note (Signed)
Addended by: Wynn Banker on: 06/14/2019 03:31 PM   Modules accepted: Orders

## 2019-07-24 ENCOUNTER — Other Ambulatory Visit: Payer: Self-pay | Admitting: *Deleted

## 2019-07-24 DIAGNOSIS — F321 Major depressive disorder, single episode, moderate: Secondary | ICD-10-CM

## 2019-07-24 MED ORDER — SERTRALINE HCL 100 MG PO TABS: 100.0000 mg | ORAL_TABLET | Freq: Every day | ORAL | 1 refills | Status: DC

## 2019-07-24 NOTE — Telephone Encounter (Signed)
Rx done. 

## 2019-08-11 ENCOUNTER — Other Ambulatory Visit: Payer: Self-pay | Admitting: Family Medicine

## 2019-08-11 DIAGNOSIS — F17200 Nicotine dependence, unspecified, uncomplicated: Secondary | ICD-10-CM

## 2019-08-14 ENCOUNTER — Other Ambulatory Visit: Payer: Self-pay | Admitting: Family Medicine

## 2019-08-14 DIAGNOSIS — G4700 Insomnia, unspecified: Secondary | ICD-10-CM

## 2019-08-15 ENCOUNTER — Encounter: Payer: Self-pay | Admitting: Family Medicine

## 2019-08-27 ENCOUNTER — Telehealth: Payer: Self-pay | Admitting: *Deleted

## 2019-08-27 NOTE — Telephone Encounter (Signed)
-----   Message from Wynn Banker, MD sent at 08/23/2019 10:53 PM EDT ----- Patient has not returned calls to [facility]; just check in with her and make sure she has number. Thanks! ----- Message ----- From: Teena Dunk Sent: 08/15/2019   8:37 AM EDT To: Wynn Banker, MD

## 2019-08-27 NOTE — Telephone Encounter (Signed)
Left a detailed message with the information below and asked that the pt call [phone] to schedule an appt with GI.

## 2019-10-12 ENCOUNTER — Other Ambulatory Visit: Payer: Self-pay | Admitting: Family Medicine

## 2019-10-12 ENCOUNTER — Encounter: Payer: Self-pay | Admitting: Family Medicine

## 2019-10-12 MED ORDER — AMLODIPINE BESYLATE 10 MG PO TABS: 10.0000 mg | ORAL_TABLET | Freq: Every day | ORAL | 1 refills | Status: DC

## 2019-10-12 MED ORDER — VALSARTAN-HYDROCHLOROTHIAZIDE 320-25 MG PO TABS: 1.0000 | ORAL_TABLET | Freq: Every day | ORAL | 1 refills | Status: DC

## 2019-11-05 ENCOUNTER — Other Ambulatory Visit: Payer: Self-pay | Admitting: Family Medicine

## 2019-11-05 DIAGNOSIS — G4700 Insomnia, unspecified: Secondary | ICD-10-CM

## 2019-11-06 NOTE — Telephone Encounter (Signed)
zolpidem (AMBIEN) 10 MG tablet   [id number] [place], [place] - [address] AT [facility] [address] [facility] [address] Phone:  [phone]  Fax:  [phone]

## 2019-12-04 ENCOUNTER — Other Ambulatory Visit: Payer: Self-pay | Admitting: Family Medicine

## 2019-12-04 DIAGNOSIS — F17200 Nicotine dependence, unspecified, uncomplicated: Secondary | ICD-10-CM

## 2020-01-27 ENCOUNTER — Other Ambulatory Visit: Payer: Self-pay | Admitting: Family Medicine

## 2020-01-27 DIAGNOSIS — F321 Major depressive disorder, single episode, moderate: Secondary | ICD-10-CM

## 2020-01-28 ENCOUNTER — Other Ambulatory Visit: Payer: Self-pay | Admitting: Family Medicine

## 2020-01-28 ENCOUNTER — Encounter: Payer: Self-pay | Admitting: Family Medicine

## 2020-01-28 DIAGNOSIS — G4700 Insomnia, unspecified: Secondary | ICD-10-CM

## 2020-02-25 ENCOUNTER — Other Ambulatory Visit: Payer: Self-pay | Admitting: Family Medicine

## 2020-02-25 DIAGNOSIS — F17200 Nicotine dependence, unspecified, uncomplicated: Secondary | ICD-10-CM

## 2020-04-08 ENCOUNTER — Other Ambulatory Visit: Payer: Self-pay | Admitting: Family Medicine

## 2020-04-08 DIAGNOSIS — G4700 Insomnia, unspecified: Secondary | ICD-10-CM

## 2020-04-08 DIAGNOSIS — F321 Major depressive disorder, single episode, moderate: Secondary | ICD-10-CM

## 2020-04-17 ENCOUNTER — Telehealth: Payer: Self-pay | Admitting: Family Medicine

## 2020-04-17 NOTE — Telephone Encounter (Signed)
[name] from [facility] call and stated pt need a refill on  valsartan-hydrochlorothiazide (DIOVAN-HCT) 320-25 MG tablet sent to  [facility] [place], [place] - [address] Phone:  [phone]  Fax:  [phone]

## 2020-04-18 MED ORDER — VALSARTAN-HYDROCHLOROTHIAZIDE 320-25 MG PO TABS
1.0000 | ORAL_TABLET | Freq: Every day | ORAL | 0 refills | Status: DC
Start: 2020-04-18 — End: 2020-10-29

## 2020-04-18 NOTE — Telephone Encounter (Signed)
Rx done. 

## 2020-05-07 ENCOUNTER — Other Ambulatory Visit: Payer: Self-pay | Admitting: Family Medicine

## 2020-05-07 DIAGNOSIS — Z1231 Encounter for screening mammogram for malignant neoplasm of breast: Secondary | ICD-10-CM

## 2020-05-15 ENCOUNTER — Encounter: Payer: Self-pay | Admitting: Gastroenterology

## 2020-05-15 ENCOUNTER — Other Ambulatory Visit: Payer: Self-pay | Admitting: Family Medicine

## 2020-05-15 DIAGNOSIS — G4700 Insomnia, unspecified: Secondary | ICD-10-CM

## 2020-06-16 ENCOUNTER — Other Ambulatory Visit: Payer: Self-pay | Admitting: Family Medicine

## 2020-06-16 DIAGNOSIS — F321 Major depressive disorder, single episode, moderate: Secondary | ICD-10-CM

## 2020-06-24 ENCOUNTER — Other Ambulatory Visit: Payer: Self-pay

## 2020-06-24 ENCOUNTER — Ambulatory Visit (AMBULATORY_SURGERY_CENTER): Payer: Self-pay

## 2020-06-24 VITALS — Ht 67.5 in | Wt 202.0 lb

## 2020-06-24 DIAGNOSIS — Z800 Family history of malignant neoplasm of digestive organs: Secondary | ICD-10-CM

## 2020-06-24 MED ORDER — PLENVU 140 G PO SOLR: 1.0000 | Freq: Once | ORAL | 0 refills | Status: AC

## 2020-06-24 NOTE — Progress Notes (Signed)
No egg or soy allergy known to patient  No issues with past sedation with any surgeries or procedures Patient denies ever being told they had issues or difficulty with intubation  No FH of Malignant Hyperthermia No diet pills per patient No home 02 use per patient  No blood thinners per patient  Pt denies issues with constipation  No A fib or A flutter  EMMI video to pt or via MyChart  COVID 19 guidelines implemented in PV today with Pt and RN  Pt is fully vaccinated  for Covid   plenvu Coupon given to pt in PV today , Code to Pharmacy and  NO PA's for preps discussed with pt In PV today  Discussed with pt there will be an out-of-pocket cost for prep and that varies from $0 to 70 dollars   Due to the COVID-19 pandemic we are asking patients to follow certain guidelines.  Pt aware of COVID protocols and [facility] guidelines   

## 2020-06-25 ENCOUNTER — Ambulatory Visit: Payer: BC Managed Care – PPO

## 2020-06-28 ENCOUNTER — Ambulatory Visit
Admission: RE | Admit: 2020-06-28 | Discharge: 2020-06-28 | Disposition: A | Discharge status: Home / Self Care | Payer: BC Managed Care – PPO | Source: Ambulatory Visit | Attending: Family Medicine | Admitting: Family Medicine

## 2020-06-28 DIAGNOSIS — Z1231 Encounter for screening mammogram for malignant neoplasm of breast: Secondary | ICD-10-CM | POA: Diagnosis not present

## 2020-07-08 ENCOUNTER — Encounter: Payer: BC Managed Care – PPO | Admitting: Gastroenterology

## 2020-07-31 ENCOUNTER — Other Ambulatory Visit: Payer: Self-pay | Admitting: Family Medicine

## 2020-07-31 ENCOUNTER — Encounter: Payer: Self-pay | Admitting: Family Medicine

## 2020-07-31 DIAGNOSIS — G4700 Insomnia, unspecified: Secondary | ICD-10-CM

## 2020-09-17 ENCOUNTER — Encounter: Payer: BC Managed Care – PPO | Admitting: Gastroenterology

## 2020-10-17 ENCOUNTER — Other Ambulatory Visit: Payer: Self-pay | Admitting: Family Medicine

## 2020-10-17 DIAGNOSIS — F321 Major depressive disorder, single episode, moderate: Secondary | ICD-10-CM

## 2020-10-29 ENCOUNTER — Telehealth: Payer: Self-pay | Admitting: Family Medicine

## 2020-10-29 ENCOUNTER — Other Ambulatory Visit: Payer: Self-pay | Admitting: Family Medicine

## 2020-10-29 ENCOUNTER — Encounter: Payer: Self-pay | Admitting: Family Medicine

## 2020-10-29 MED ORDER — AMLODIPINE BESYLATE 10 MG PO TABS: 10.0000 mg | ORAL_TABLET | Freq: Every day | ORAL | 0 refills | Status: DC

## 2020-10-29 MED ORDER — VALSARTAN-HYDROCHLOROTHIAZIDE 320-25 MG PO TABS: 1.0000 | ORAL_TABLET | Freq: Every day | ORAL | 0 refills | Status: DC

## 2020-10-29 NOTE — Telephone Encounter (Signed)
Pt is calling in to let Dr. Hassan Rowan know that she has made an appointment and would like to get the medication that was declined sent in to her pharmacy was not able to give the name of the medications to me and stated that she will send a mychart msg b/c she was not at home to give the names of them.

## 2020-10-29 NOTE — Telephone Encounter (Signed)
Please see Mychart message.

## 2020-10-30 MED ORDER — AMLODIPINE BESYLATE 10 MG PO TABS: 10.0000 mg | ORAL_TABLET | Freq: Every day | ORAL | 0 refills | Status: DC

## 2020-10-30 MED ORDER — VALSARTAN-HYDROCHLOROTHIAZIDE 320-25 MG PO TABS: 1.0000 | ORAL_TABLET | Freq: Every day | ORAL | 0 refills | Status: DC

## 2020-10-30 NOTE — Addendum Note (Signed)
Addended by: Johnella Moloney on: 10/30/2020 08:59 AM   Modules accepted: Orders

## 2020-10-31 ENCOUNTER — Other Ambulatory Visit: Payer: Self-pay | Admitting: Family Medicine

## 2020-10-31 DIAGNOSIS — G4700 Insomnia, unspecified: Secondary | ICD-10-CM

## 2020-11-26 ENCOUNTER — Ambulatory Visit: Payer: BC Managed Care – PPO | Admitting: Family Medicine

## 2020-11-26 ENCOUNTER — Encounter: Payer: Self-pay | Admitting: Family Medicine

## 2020-11-26 ENCOUNTER — Other Ambulatory Visit: Payer: Self-pay

## 2020-11-26 VITALS — BP 132/80 | HR 78 | Temp 98.3°F | Ht 67.5 in | Wt 193.3 lb

## 2020-11-26 DIAGNOSIS — E782 Mixed hyperlipidemia: Secondary | ICD-10-CM

## 2020-11-26 DIAGNOSIS — L309 Dermatitis, unspecified: Secondary | ICD-10-CM | POA: Diagnosis not present

## 2020-11-26 DIAGNOSIS — I10 Essential (primary) hypertension: Secondary | ICD-10-CM | POA: Diagnosis not present

## 2020-11-26 DIAGNOSIS — F321 Major depressive disorder, single episode, moderate: Secondary | ICD-10-CM

## 2020-11-26 LAB — COMPREHENSIVE METABOLIC PANEL
ALT: 26 U/L (ref 0–35)
AST: 20 U/L (ref 0–37)
Albumin: 4.2 g/dL (ref 3.5–5.2)
Alkaline Phosphatase: 100 U/L (ref 39–117)
BUN: 10 mg/dL (ref 6–23)
CO2: 27 mEq/L (ref 19–32)
Calcium: 9.5 mg/dL (ref 8.4–10.5)
Chloride: 101 mEq/L (ref 96–112)
Creatinine, Ser: 0.86 mg/dL (ref 0.40–1.20)
GFR: 73.32 mL/min (ref >60.00)
Glucose, Bld: 80 mg/dL (ref 70–99)
Potassium: 4.7 mEq/L (ref 3.5–5.1)
Sodium: 136 mEq/L (ref 135–145)
Total Bilirubin: 0.6 mg/dL (ref 0.2–1.2)
Total Protein: 6.6 g/dL (ref 6.0–8.3)

## 2020-11-26 LAB — CBC WITH DIFFERENTIAL/PLATELET
Basophils Absolute: 0.1 10*3/uL (ref 0.0–0.1)
Basophils Relative: 0.9 % (ref 0.0–3.0)
Eosinophils Absolute: 0.4 10*3/uL (ref 0.0–0.7)
Eosinophils Relative: 6.5 % — ABNORMAL HIGH (ref 0.0–5.0)
HCT: 46.7 % — ABNORMAL HIGH (ref 36.0–46.0)
Hemoglobin: 16.2 g/dL — ABNORMAL HIGH (ref 12.0–15.0)
Lymphocytes Relative: 23.4 % (ref 12.0–46.0)
Lymphs Abs: 1.4 10*3/uL (ref 0.7–4.0)
MCHC: 34.8 g/dL (ref 30.0–36.0)
MCV: 98.3 fl (ref 78.0–100.0)
Monocytes Absolute: 0.5 10*3/uL (ref 0.1–1.0)
Monocytes Relative: 7.9 % (ref 3.0–12.0)
Neutro Abs: 3.6 10*3/uL (ref 1.4–7.7)
Neutrophils Relative %: 61.3 % (ref 43.0–77.0)
Platelets: 361 10*3/uL (ref 150.0–400.0)
RBC: 4.74 Mil/uL (ref 3.87–5.11)
RDW: 12.9 % (ref 11.5–15.5)
WBC: 5.9 10*3/uL (ref 4.0–10.5)

## 2020-11-26 LAB — LIPID PANEL
Cholesterol: 211 mg/dL — ABNORMAL HIGH (ref 0–200)
HDL: 76.1 mg/dL (ref >39.00)
LDL Cholesterol: 117 mg/dL — ABNORMAL HIGH (ref 0–99)
NonHDL: 134.42
Total CHOL/HDL Ratio: 3
Triglycerides: 88 mg/dL (ref 0.0–149.0)
VLDL: 17.6 mg/dL (ref 0.0–40.0)

## 2020-11-26 MED ORDER — VALSARTAN-HYDROCHLOROTHIAZIDE 160-25 MG PO TABS: 2.0000 | ORAL_TABLET | Freq: Every day | ORAL | 1 refills | Status: DC

## 2020-11-26 MED ORDER — DESONIDE 0.05 % EX CREA: TOPICAL_CREAM | Freq: Two times a day (BID) | CUTANEOUS | 0 refills | Status: DC | PRN

## 2020-11-26 MED ORDER — SERTRALINE HCL 100 MG PO TABS: 100.0000 mg | ORAL_TABLET | Freq: Every day | ORAL | 1 refills | Status: DC

## 2020-11-26 MED ORDER — AMLODIPINE BESYLATE 10 MG PO TABS: 10.0000 mg | ORAL_TABLET | Freq: Every day | ORAL | 1 refills | Status: DC

## 2020-11-26 NOTE — Progress Notes (Signed)
Alexandria Cruz DOB: 25-Nov-1960 Encounter date: 11/26/2020  This is a 60 y.o. female who presents with Chief Complaint  Patient presents with   Medication Refill    History of present illness:  HTN: hctz 50mg , losartan 100mg , amlodipine 10mg . Normal when she checks at home.   Insomnia: sleeps with ambien; not good sleeper without.   Depression/anxiety: mood has been doing well. Continues to work from home which has been good for her.   Tobacco use: still some interest in quitting smoking - for own health. Doesn't smoke in house.   Toe still issue - second toe. Did get better until this year where she is having a harder time clearing after winter months. Was really bad in January months.   Allergies  Allergen Reactions   Codeine Swelling    Face turns red hand hot   Minocycline Hcl Swelling    SKIN TURNED BLACK   Current Meds  Medication Sig   amLODipine (NORVASC) 10 MG tablet Take 1 tablet (10 mg total) by mouth daily.   buPROPion (WELLBUTRIN SR) 150 MG 12 hr tablet TAKE 1 TABLET BY MOUTH EVERY DAY FOR 7 DAYS THEN INCREASE TO 1 TABLET TWICE A DAY (Patient taking differently: Take 150 mg by mouth daily.)   sertraline (ZOLOFT) 100 MG tablet TAKE 1 TABLET ONCE DAILY.   triamcinolone cream (KENALOG) 0.1 % Apply 1 application topically 2 (two) times daily.   valsartan-hydrochlorothiazide (DIOVAN-HCT) 320-25 MG tablet Take 1 tablet by mouth daily.   zolpidem (AMBIEN) 10 MG tablet TAKE 0.5 TO 1 TABLET AT BEDTIME AS NEEDED FOR SLEEP FOR UP TO 30 DAYS    Review of Systems  Constitutional:  Negative for chills, fatigue and fever.  Respiratory:  Negative for cough, chest tightness, shortness of breath and wheezing.   Cardiovascular:  Negative for chest pain, palpitations and leg swelling.   Objective:  BP 132/90 (BP Location: Left Arm, Patient Position: Sitting, Cuff Size: Large)   Pulse 78   Temp 98.3 F (36.8 C) (Oral)   Ht 5' 7.5" (1.715 m)   Wt 193 lb 4.8 oz (87.7 kg)    LMP 10/28/2010   SpO2 99%   BMI 29.83 kg/m   Weight: 193 lb 4.8 oz (87.7 kg)   BP Readings from Last 3 Encounters:  11/26/20 132/90  06/13/19 (!) 160/90  06/16/18 (!) 150/90   Wt Readings from Last 3 Encounters:  11/26/20 193 lb 4.8 oz (87.7 kg)  06/24/20 202 lb (91.6 kg)  06/13/19 211 lb 4.8 oz (95.8 kg)    Physical Exam Constitutional:      General: She is not in acute distress.    Appearance: She is well-developed.  Cardiovascular:     Rate and Rhythm: Normal rate and regular rhythm.     Heart sounds: Normal heart sounds. No murmur heard.   No friction rub.  Pulmonary:     Effort: Pulmonary effort is normal. No respiratory distress.     Breath sounds: Normal breath sounds. No wheezing or rales.  Musculoskeletal:     Right lower leg: No edema.     Left lower leg: No edema.  Skin:    Comments: No active toe eczema; slightly red (2nd toe)  Neurological:     Mental Status: She is alert and oriented to person, place, and time.  Psychiatric:        Behavior: Behavior normal.    Assessment/Plan  1. Essential hypertension Improved control. Congratulated on weight loss which has likely contributed.  Continue to monitor at home; would like to see pressures out of the mid 130's and in the 70-80 range rather than 80-90 she sees at home. I'm going to increase the hctz component of there diovan and see if this will help. We discussed quitting smoking as well and how this will help. - amLODipine (NORVASC) 10 MG tablet; Take 1 tablet (10 mg total) by mouth daily.  Dispense: 90 tablet; Refill: 1 - valsartan-hydrochlorothiazide (DIOVAN-HCT) 160-25 MG tablet; Take 2 tablets by mouth daily.  Dispense: 180 tablet; Refill: 1 - CBC with Differential/Platelet; Future - Comprehensive metabolic panel; Future  2. Hyperlipidemia, mixed Has been diet controlled. - Lipid panel; Future  3. Depression, major, single episode, moderate (HCC) Stable. Continue current medication. - sertraline  (ZOLOFT) 100 MG tablet; Take 1 tablet (100 mg total) by mouth daily.  Dispense: 90 tablet; Refill: 1  4. Dermatitis Triamcinolone helps but not fully. Will try more potent topical for use when needed. - desonide (DESOWEN) 0.05 % cream; Apply topically 2 (two) times daily as needed.  Dispense: 30 g; Refill: 0   Return in about 6 months (around 05/29/2021) for physical exam.     Theodis Shove, MD

## 2020-12-03 ENCOUNTER — Encounter: Payer: BC Managed Care – PPO | Admitting: Gastroenterology

## 2020-12-26 ENCOUNTER — Other Ambulatory Visit: Payer: Self-pay | Admitting: Family Medicine

## 2020-12-26 DIAGNOSIS — F17200 Nicotine dependence, unspecified, uncomplicated: Secondary | ICD-10-CM

## 2021-04-06 ENCOUNTER — Other Ambulatory Visit: Payer: Self-pay | Admitting: Family Medicine

## 2021-04-06 DIAGNOSIS — F17200 Nicotine dependence, unspecified, uncomplicated: Secondary | ICD-10-CM

## 2021-04-17 ENCOUNTER — Other Ambulatory Visit: Payer: Self-pay | Admitting: Family Medicine

## 2021-04-17 DIAGNOSIS — G4700 Insomnia, unspecified: Secondary | ICD-10-CM

## 2021-07-03 ENCOUNTER — Telehealth: Payer: Self-pay

## 2021-07-03 NOTE — Telephone Encounter (Signed)
Last OV 11/26/20: Return in about 6 months (around 05/29/2021) for physical exam. ?No appt noted. ?LVM instructions for pt to call to schedule cpe; also wanted to know flu vaccine status for this year. ?

## 2021-07-10 ENCOUNTER — Other Ambulatory Visit: Payer: Self-pay | Admitting: Family Medicine

## 2021-07-10 DIAGNOSIS — F321 Major depressive disorder, single episode, moderate: Secondary | ICD-10-CM

## 2021-07-10 DIAGNOSIS — I10 Essential (primary) hypertension: Secondary | ICD-10-CM

## 2021-07-10 DIAGNOSIS — G4700 Insomnia, unspecified: Secondary | ICD-10-CM

## 2021-07-23 ENCOUNTER — Other Ambulatory Visit: Payer: Self-pay | Admitting: Family Medicine

## 2021-07-23 DIAGNOSIS — F17200 Nicotine dependence, unspecified, uncomplicated: Secondary | ICD-10-CM

## 2021-07-23 DIAGNOSIS — I10 Essential (primary) hypertension: Secondary | ICD-10-CM

## 2021-11-05 ENCOUNTER — Other Ambulatory Visit: Payer: Self-pay | Admitting: *Deleted

## 2021-11-05 DIAGNOSIS — F17200 Nicotine dependence, unspecified, uncomplicated: Secondary | ICD-10-CM

## 2021-11-05 DIAGNOSIS — I10 Essential (primary) hypertension: Secondary | ICD-10-CM

## 2021-11-06 MED ORDER — BUPROPION HCL ER (SR) 150 MG PO TB12: ORAL_TABLET | ORAL | 0 refills | Status: DC

## 2021-11-06 MED ORDER — AMLODIPINE BESYLATE 10 MG PO TABS: 10.0000 mg | ORAL_TABLET | Freq: Every day | ORAL | 0 refills | Status: DC

## 2021-11-06 MED ORDER — VALSARTAN-HYDROCHLOROTHIAZIDE 160-25 MG PO TABS: 2.0000 | ORAL_TABLET | Freq: Every day | ORAL | 0 refills | Status: DC

## 2021-11-06 NOTE — Telephone Encounter (Signed)
I will fill but she hasn't been seen in almost a year, needs appt

## 2021-11-09 NOTE — Telephone Encounter (Signed)
Left a detailed message at the patient's cell number to schedule a transfer of care visit as below. 

## 2021-11-13 IMAGING — MG MM DIGITAL SCREENING BILAT W/ TOMO AND CAD
6 of 10 series · 6 of 30 positions shown · non-contrast
Comparison: Previous exam(s).

CLINICAL DATA: Screening.

EXAM:
DIGITAL SCREENING BILATERAL MAMMOGRAM WITH TOMOSYNTHESIS AND CAD
TECHNIQUE: Bilateral screening digital craniocaudal and mediolateral oblique
mammograms were obtained. Bilateral screening digital breast
tomosynthesis was performed. The images were evaluated with
computer-aided detection.

[R MLO synth-2D]
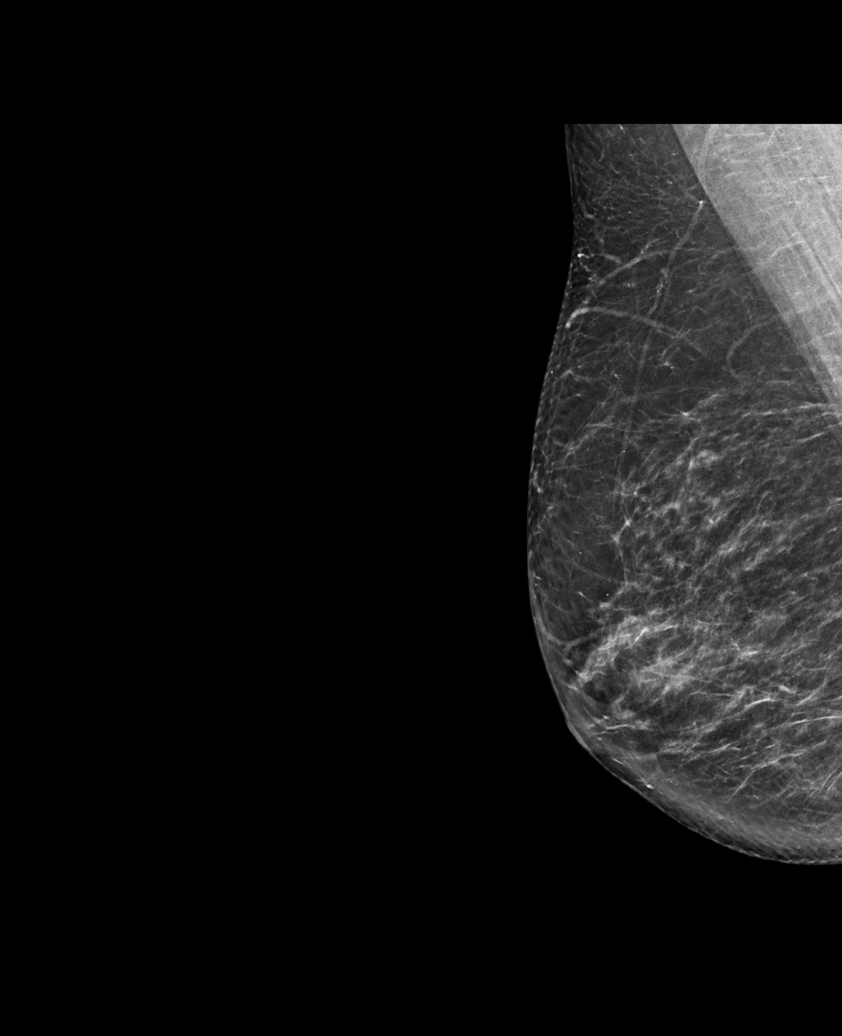

[L CC synth-2D]
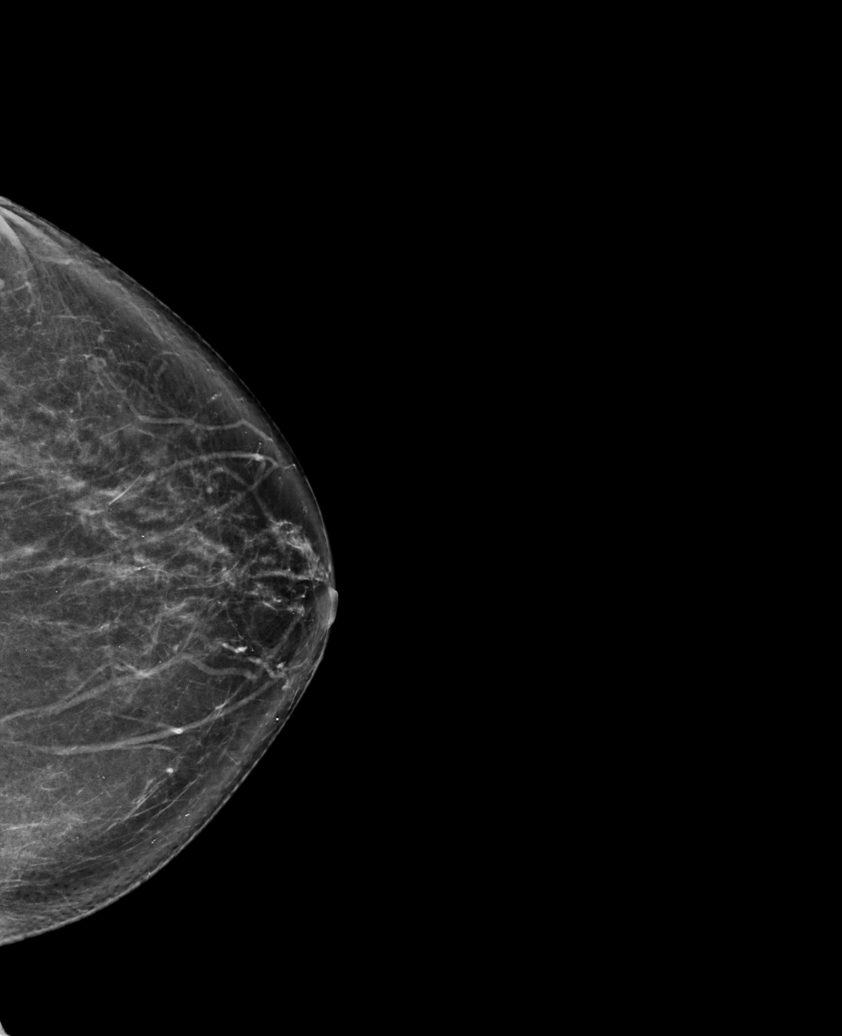

[L MLO synth-2D (1 of 2)]
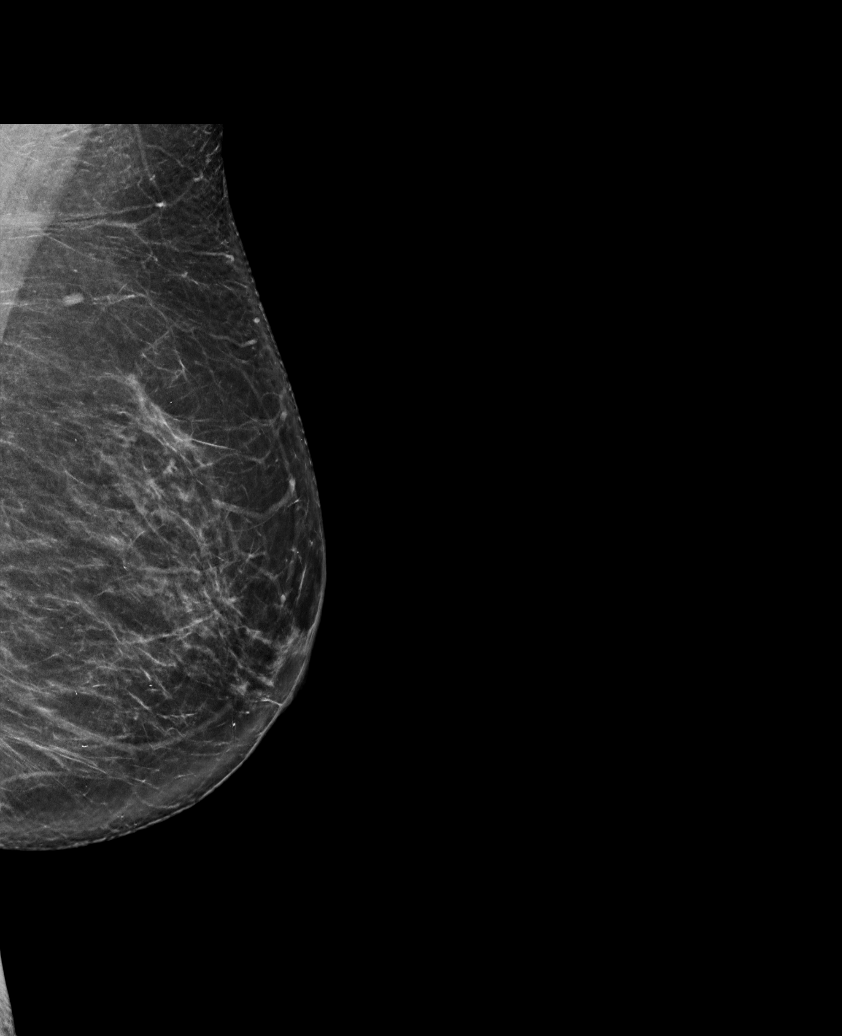

[R CC synth-2D]
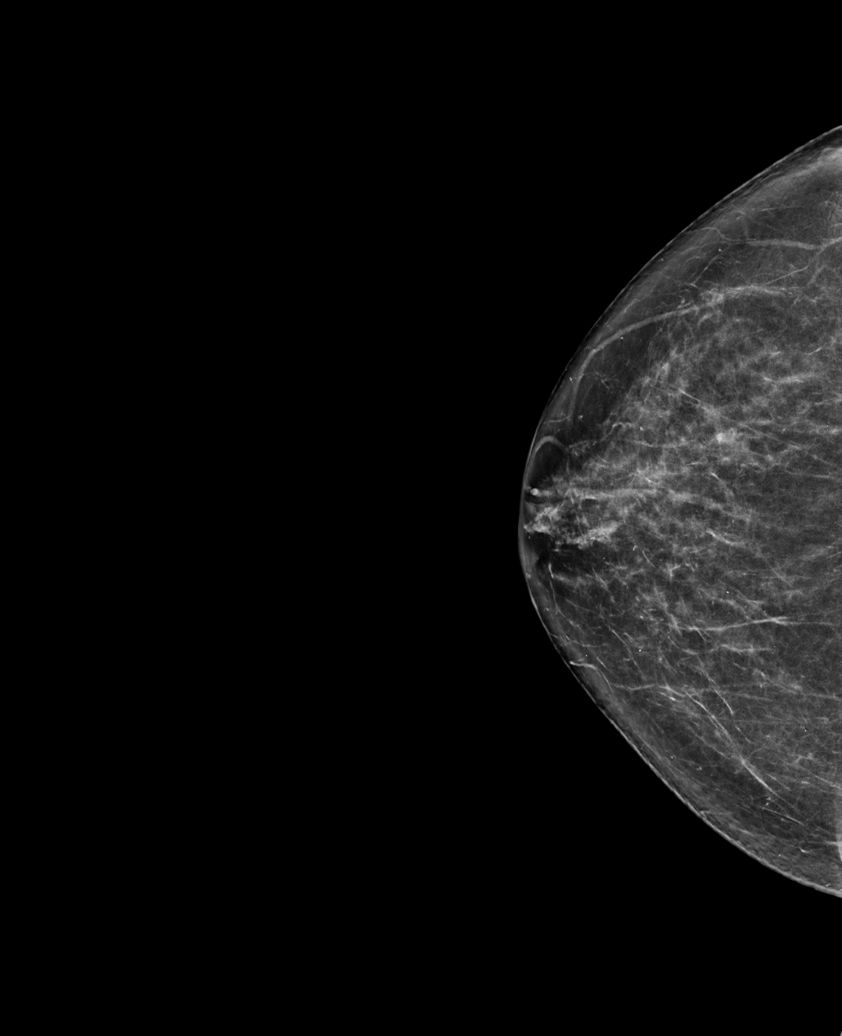

[L MLO synth-2D (2 of 2)]
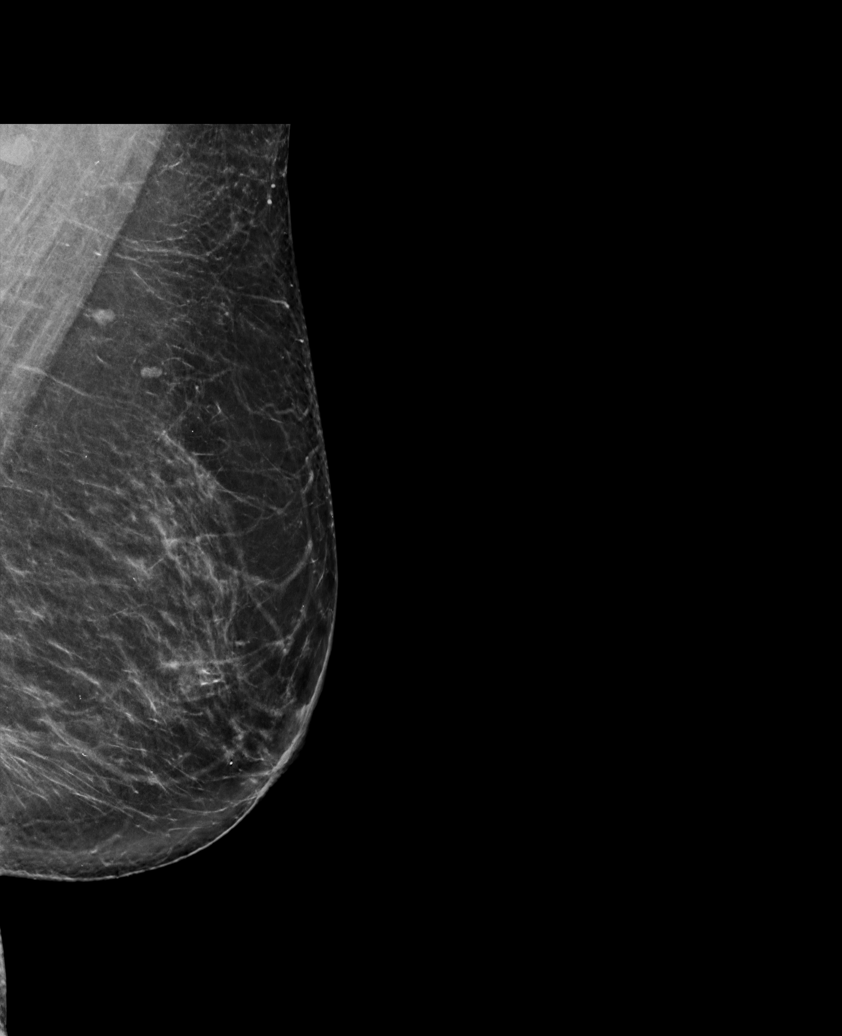

[R CC tomo · tomo slice 37/73.0]
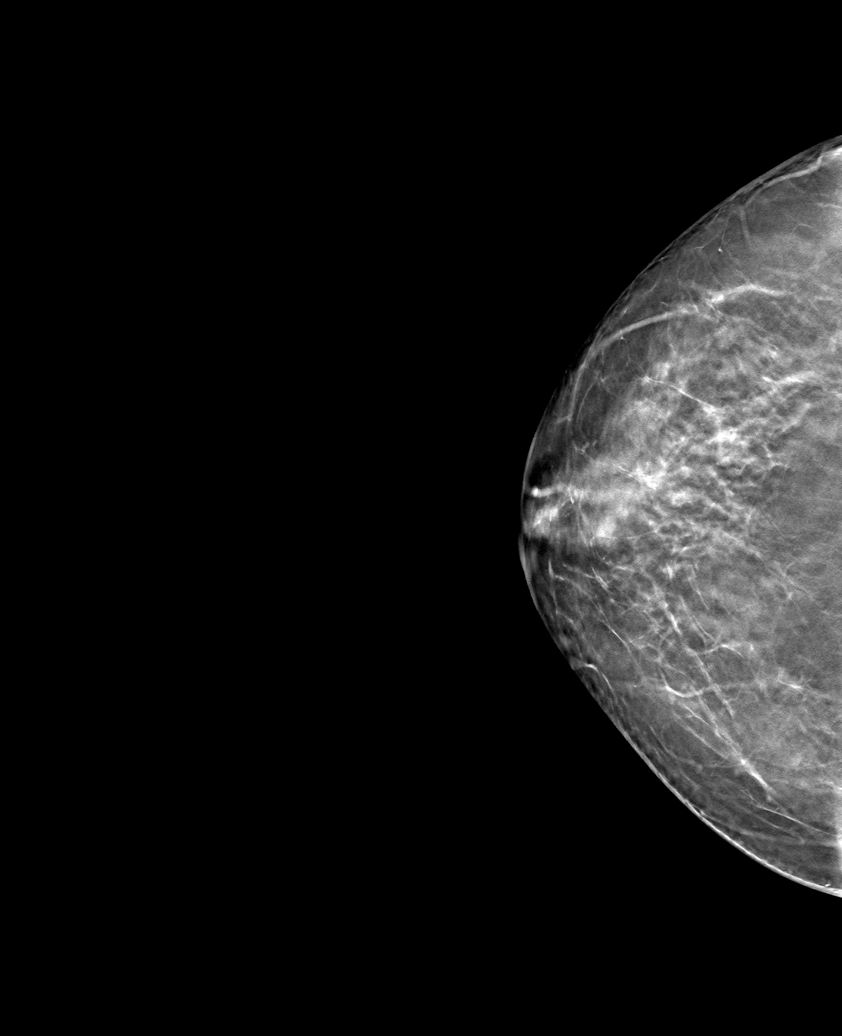

[6 of 30 positions shown; findings below may reference images not displayed]

ACR Breast Density Category b: There are scattered areas of
fibroglandular density.
FINDINGS: There are no findings suspicious for malignancy. The images were
evaluated with computer-aided detection.
IMPRESSION: No mammographic evidence of malignancy. A result letter of this
screening mammogram will be mailed directly to the patient.

RECOMMENDATION:
Screening mammogram in one year. (Code:WJ-I-BG6)

BI-RADS CATEGORY  1: Negative.

## 2021-12-03 ENCOUNTER — Ambulatory Visit: Payer: BC Managed Care – PPO | Admitting: Family Medicine

## 2021-12-03 ENCOUNTER — Encounter: Payer: Self-pay | Admitting: Family Medicine

## 2021-12-03 VITALS — BP 140/80 | HR 75 | Temp 98.3°F | Ht 67.5 in | Wt 196.3 lb

## 2021-12-03 DIAGNOSIS — G4700 Insomnia, unspecified: Secondary | ICD-10-CM | POA: Diagnosis not present

## 2021-12-03 DIAGNOSIS — I10 Essential (primary) hypertension: Secondary | ICD-10-CM

## 2021-12-03 DIAGNOSIS — Z1211 Encounter for screening for malignant neoplasm of colon: Secondary | ICD-10-CM

## 2021-12-03 DIAGNOSIS — Z23 Encounter for immunization: Secondary | ICD-10-CM

## 2021-12-03 DIAGNOSIS — F321 Major depressive disorder, single episode, moderate: Secondary | ICD-10-CM

## 2021-12-03 DIAGNOSIS — Z1231 Encounter for screening mammogram for malignant neoplasm of breast: Secondary | ICD-10-CM

## 2021-12-03 DIAGNOSIS — F3341 Major depressive disorder, recurrent, in partial remission: Secondary | ICD-10-CM

## 2021-12-03 LAB — COMPREHENSIVE METABOLIC PANEL
ALT: 28 U/L (ref 0–35)
AST: 23 U/L (ref 0–37)
Albumin: 4.3 g/dL (ref 3.5–5.2)
Alkaline Phosphatase: 114 U/L (ref 39–117)
BUN: 16 mg/dL (ref 6–23)
CO2: 27 mEq/L (ref 19–32)
Calcium: 9.6 mg/dL (ref 8.4–10.5)
Chloride: 100 mEq/L (ref 96–112)
Creatinine, Ser: 0.82 mg/dL (ref 0.40–1.20)
GFR: 77.08 mL/min (ref >60.00)
Glucose, Bld: 79 mg/dL (ref 70–99)
Potassium: 4.2 mEq/L (ref 3.5–5.1)
Sodium: 136 mEq/L (ref 135–145)
Total Bilirubin: 0.5 mg/dL (ref 0.2–1.2)
Total Protein: 7 g/dL (ref 6.0–8.3)

## 2021-12-03 LAB — LIPID PANEL
Cholesterol: 218 mg/dL — ABNORMAL HIGH (ref 0–200)
HDL: 76.1 mg/dL (ref >39.00)
LDL Cholesterol: 123 mg/dL — ABNORMAL HIGH (ref 0–99)
NonHDL: 141.65
Total CHOL/HDL Ratio: 3
Triglycerides: 94 mg/dL (ref 0.0–149.0)
VLDL: 18.8 mg/dL (ref 0.0–40.0)

## 2021-12-03 MED ORDER — SERTRALINE HCL 100 MG PO TABS: 100.0000 mg | ORAL_TABLET | Freq: Every day | ORAL | 3 refills | Status: DC

## 2021-12-03 MED ORDER — ZOLPIDEM TARTRATE 10 MG PO TABS
10.0000 mg | ORAL_TABLET | Freq: Every day | ORAL | 0 refills | Status: DC
Start: 2021-12-03 — End: 2022-03-04

## 2021-12-03 NOTE — Assessment & Plan Note (Signed)
NOT well controlled on her current medications. We discussed this at length. I advised she check her BP at home daily for the next 1-2 weeks and send me the readings on MyChart. If it remains elevated at home then will increase her diovan. Current hypertension medications:      Sig   amLODipine (NORVASC) 10 MG tablet (Taking) Take 1 tablet (10 mg total) by mouth daily.   valsartan-hydrochlorothiazide (DIOVAN-HCT) 160-25 MG tablet (Taking) Take 2 tablets by mouth daily.

## 2021-12-03 NOTE — Assessment & Plan Note (Signed)
Chronic, on ambien. This was refilled for her and I advised that she take the whole tablet at bedtime instead of waking up in the middle of the night for the second half. We discussed the high risk nature of this medication, and we discussed safer alternatives to be used in the future.

## 2021-12-03 NOTE — Patient Instructions (Addendum)
Ask your pharmacist about the RSV vaccine.   Check your BP every day at home for 1-2 weeks, send me your BP readings over MyChart so I can review. Your BP goal is 140/90 or less.

## 2021-12-03 NOTE — Progress Notes (Signed)
Established Patient Office Visit  Subjective   Patient ID: Alexandria Cruz, female    DOB: Nov 13, 1960  Age: 61 y.o. MRN: [id number]  Chief Complaint  Patient presents with   Establish Care    Patient is here for transition of care visit. She reports she has no new symptoms or concerns to discuss today. We reviewed her current medications and problem list and we reviewed her HM measures. She is due for her colonoscopy, mammogram and pap smear.   Insomnia-- patient reports she has been taking 1/2 tablet of ambien at bedtime then waking up and taking an additional 1/2 tbalet around 2-3 am. States she hasn't tried taking a whole tablet at bedtime. She denies any morning grogginess, no abnormal dreams. We discussed the risks/potential side effects vs. Benefits of the medication.   Depression-- pt reports good symptom control with the sertraline. Reports no side effects, states she is very satisfied with the effect of the medication.   Flowsheet Row Office Visit from 12/03/2021 in [facility] at  [place]  PHQ-9 Total Score 2   HTN-- BP performed in office today twice and was elevated both times. Pt denies any chest pain, no SOB or headaches. Reports compliance with her medication, is not checking her BP at home. We reviewed her medications. Has not been following a low salt diet.  I also reviewed the patient's health maintenance measures and ordered her preventative testing.  Patient Active Problem List   Diagnosis Date Noted   Screening for cervical cancer 06/28/2017   Depression, major, recurrent, in partial remission (HCC) 12/14/2016   Insomnia 04/15/2014   Hyperlipidemia, mixed 02/17/2011   Menopausal symptoms 02/04/2011   Essential hypertension 02/24/2010   Allergies 02/24/2010      Review of Systems  All other systems reviewed and are negative.     Objective:     BP (!) 140/80 (BP Location: Left Arm, Patient Position: Sitting, Cuff Size: Large)   Pulse 75    Temp 98.3 F (36.8 C) (Oral)   Ht 5' 7.5" (1.715 m)   Wt 196 lb 4.8 oz (89 kg)   LMP 10/28/2010   SpO2 98%   BMI 30.29 kg/m  BP Readings from Last 3 Encounters:  12/03/21 (!) 140/80  11/26/20 132/80  06/13/19 (!) 160/90   Wt Readings from Last 3 Encounters:  12/03/21 196 lb 4.8 oz (89 kg)  11/26/20 193 lb 4.8 oz (87.7 kg)  06/24/20 202 lb (91.6 kg)      Physical Exam Vitals reviewed.  Constitutional:      Appearance: Normal appearance. She is well-groomed and normal weight.  HENT:     Head: Normocephalic and atraumatic.  Eyes:     Extraocular Movements: Extraocular movements intact.     Conjunctiva/sclera: Conjunctivae normal.  Cardiovascular:     Rate and Rhythm: Normal rate and regular rhythm.     Heart sounds: S1 normal and S2 normal.  Pulmonary:     Effort: Pulmonary effort is normal.     Breath sounds: Normal breath sounds and air entry.  Abdominal:     General: Bowel sounds are normal.  Musculoskeletal:        General: Normal range of motion.     Cervical back: Normal range of motion and neck supple.     Right lower leg: No edema.     Left lower leg: No edema.  Skin:    General: Skin is warm and dry.  Neurological:     Mental Status:  She is alert and oriented to person, place, and time. Mental status is at baseline.     Gait: Gait is intact.  Psychiatric:        Mood and Affect: Mood and affect normal.        Speech: Speech normal.        Behavior: Behavior normal.        Judgment: Judgment normal.      Last hemoglobin A1c Lab Results  Component Value Date   HGBA1C 5.3 01/27/2018      The 10-year ASCVD risk score (Arnett DK, et al., 2019) is: 10.3%    Assessment & Plan:   Problem List Items Addressed This Visit       Cardiovascular and Mediastinum   Essential hypertension - Primary    NOT well controlled on her current medications. We discussed this at length. I advised she check her BP at home daily for the next 1-2 weeks and send me the  readings on MyChart. If it remains elevated at home then will increase her diovan. Current hypertension medications:       Sig   amLODipine (NORVASC) 10 MG tablet (Taking) Take 1 tablet (10 mg total) by mouth daily.   valsartan-hydrochlorothiazide (DIOVAN-HCT) 160-25 MG tablet (Taking) Take 2 tablets by mouth daily.            Relevant Orders   CMP (Completed)   Lipid Panel (Completed)     Other   Insomnia    Chronic, on ambien. This was refilled for her and I advised that she take the whole tablet at bedtime instead of waking up in the middle of the night for the second half. We discussed the high risk nature of this medication, and we discussed safer alternatives to be used in the future.      Relevant Medications   zolpidem (AMBIEN) 10 MG tablet   Depression, major, recurrent, in partial remission (HCC)    Pt reports she is currently in remission with the sertraline, will continue 100 mg daily. She is only taking the wellbutrin 150 mg once a day but states that it is working well for her.      Relevant Medications   sertraline (ZOLOFT) 100 MG tablet   Other Visit Diagnoses     Depression, major, single episode, moderate (HCC)       Relevant Medications   sertraline (ZOLOFT) 100 MG tablet   Colon cancer screening       Relevant Orders   Ambulatory referral to Gastroenterology   Encounter for screening mammogram for malignant neoplasm of breast       Relevant Orders   MM Digital Screening   Immunization due       Relevant Orders   Flu Vaccine QUAD 6+ mos PF IM (Fluarix Quad PF) (Completed)   Need for shingles vaccine       Relevant Orders   Zoster Recombinant (Shingrix ) (Completed)       Return in about 3 months (around 03/04/2022) for pap smear and BP follow up.    Karie Georges, MD

## 2021-12-03 NOTE — Assessment & Plan Note (Addendum)
Pt reports she is currently in remission with the sertraline, will continue 100 mg daily. She is only taking the wellbutrin 150 mg once a day but states that it is working well for her.

## 2022-03-01 ENCOUNTER — Other Ambulatory Visit: Payer: Self-pay | Admitting: Family Medicine

## 2022-03-01 DIAGNOSIS — I10 Essential (primary) hypertension: Secondary | ICD-10-CM

## 2022-03-04 ENCOUNTER — Encounter: Payer: Self-pay | Admitting: Family Medicine

## 2022-03-04 ENCOUNTER — Ambulatory Visit (INDEPENDENT_AMBULATORY_CARE_PROVIDER_SITE_OTHER): Payer: BC Managed Care – PPO | Admitting: Family Medicine

## 2022-03-04 ENCOUNTER — Other Ambulatory Visit (HOSPITAL_COMMUNITY)
Admission: RE | Admit: 2022-03-04 | Discharge: 2022-03-04 | Disposition: A | Discharge status: Home / Self Care | Payer: BC Managed Care – PPO | Source: Ambulatory Visit | Attending: Family Medicine | Admitting: Family Medicine

## 2022-03-04 VITALS — BP 144/80 | HR 73 | Temp 98.3°F | Ht 67.0 in | Wt 200.3 lb

## 2022-03-04 DIAGNOSIS — Z124 Encounter for screening for malignant neoplasm of cervix: Secondary | ICD-10-CM | POA: Diagnosis not present

## 2022-03-04 DIAGNOSIS — H6120 Impacted cerumen, unspecified ear: Secondary | ICD-10-CM | POA: Insufficient documentation

## 2022-03-04 DIAGNOSIS — Z0000 Encounter for general adult medical examination without abnormal findings: Secondary | ICD-10-CM | POA: Diagnosis not present

## 2022-03-04 DIAGNOSIS — H6123 Impacted cerumen, bilateral: Secondary | ICD-10-CM

## 2022-03-04 DIAGNOSIS — G4700 Insomnia, unspecified: Secondary | ICD-10-CM | POA: Diagnosis not present

## 2022-03-04 DIAGNOSIS — Z23 Encounter for immunization: Secondary | ICD-10-CM

## 2022-03-04 DIAGNOSIS — I10 Essential (primary) hypertension: Secondary | ICD-10-CM

## 2022-03-04 MED ORDER — ZOLPIDEM TARTRATE 10 MG PO TABS
10.0000 mg | ORAL_TABLET | Freq: Every day | ORAL | 0 refills | Status: DC
Start: 2022-03-04 — End: 2022-07-13

## 2022-03-04 MED ORDER — VALSARTAN-HYDROCHLOROTHIAZIDE 320-25 MG PO TABS: 1.0000 | ORAL_TABLET | Freq: Every day | ORAL | 3 refills | Status: DC

## 2022-03-04 NOTE — Assessment & Plan Note (Addendum)
BL ear, s/p irrigation and attempted manual removal with ear curette today in office. Impactions were deep and some wax was removed, however TM's could not be visualized. I recommended Debrox ear drops every night to help soften the wax and help it extrude on its own. If she continues to have problems she should return to office for further irrigation.

## 2022-03-04 NOTE — Progress Notes (Signed)
Complete physical exam  Patient: Alexandria Cruz   DOB: 10-16-1960   61 y.o. Female  MRN: [id number]  Subjective:    Chief Complaint  Patient presents with   Annual Exam    Alexandria Cruz is a 61 y.o. female who presents today for a complete physical exam. She reports consuming a general diet. The patient does not participate in regular exercise at present. She generally feels well. She reports sleeping well. She does not have additional problems to discuss today.    Most recent fall risk assessment:     No data to display           Most recent depression screenings:    03/04/2022    8:01 AM 12/03/2021   10:05 AM  PHQ 2/9 Scores  PHQ - 2 Score 0 0  PHQ- 9 Score 3 2    Dental: No current dental problems and Receives regular dental care  Patient Active Problem List   Diagnosis Date Noted   Healthy adult on routine physical examination 03/04/2022   Cerumen impaction 03/04/2022   Screening for cervical cancer 06/28/2017   Depression, major, recurrent, in partial remission (HCC) 12/14/2016   Insomnia 04/15/2014   Hyperlipidemia, mixed 02/17/2011   Menopausal symptoms 02/04/2011   Essential hypertension 02/24/2010   Allergies 02/24/2010      Patient Care Team: Karie Georges, MD as PCP - General (Family Medicine)   Outpatient Medications Prior to Visit  Medication Sig   amLODipine (NORVASC) 10 MG tablet TAKE ONE TABLET BY MOUTH DAILY (PLEASE SCHEDULE AN OFFICE VISIT)   buPROPion (WELLBUTRIN SR) 150 MG 12 hr tablet TAKE ONE TABLET BY MOUTH ONCE DAILY FOR SEVEN DAYS THEN INCREASE TO ONE TABLET TWICE DAILY   desonide (DESOWEN) 0.05 % cream Apply topically 2 (two) times daily as needed.   sertraline (ZOLOFT) 100 MG tablet Take 1 tablet (100 mg total) by mouth daily.   [DISCONTINUED] valsartan-hydrochlorothiazide (DIOVAN-HCT) 160-25 MG tablet Take 2 tablets by mouth daily.   [DISCONTINUED] zolpidem (AMBIEN) 10 MG tablet Take 1 tablet (10 mg total) by mouth at  bedtime.   No facility-administered medications prior to visit.    Review of Systems  HENT:  Negative for hearing loss.   Eyes:  Negative for blurred vision.  Respiratory:  Negative for shortness of breath.   Cardiovascular:  Negative for chest pain.  Gastrointestinal: Negative.   Genitourinary: Negative.   Musculoskeletal:  Negative for back pain.  Neurological:  Negative for headaches.  Psychiatric/Behavioral:  Negative for depression.           Objective:     BP (!) 144/80 (BP Location: Left Arm, Patient Position: Sitting, Cuff Size: Large)   Pulse 73   Temp 98.3 F (36.8 C) (Oral)   Ht 5\' 7"  (1.702 m)   Wt 200 lb 4.8 oz (90.9 kg)   LMP 10/28/2010   SpO2 98%   BMI 31.37 kg/m  BP Readings from Last 3 Encounters:  03/04/22 (!) 144/80  12/03/21 (!) 140/80  11/26/20 132/80      Physical Exam Vitals reviewed. Exam conducted with a chaperone present.  Constitutional:      Appearance: She is normal weight.  HENT:     Right Ear: There is impacted cerumen.     Left Ear: There is impacted cerumen.     Mouth/Throat:     Mouth: Mucous membranes are moist.     Pharynx: Oropharynx is clear. No oropharyngeal exudate or posterior oropharyngeal erythema.  Eyes:     Conjunctiva/sclera: Conjunctivae normal.  Neck:     Thyroid: No thyromegaly.  Cardiovascular:     Rate and Rhythm: Normal rate and regular rhythm.     Pulses: Normal pulses.     Heart sounds: Normal heart sounds.  Pulmonary:     Effort: Pulmonary effort is normal.     Breath sounds: Normal breath sounds.  Chest:     Chest wall: No mass.  Breasts:    Tanner Score is 5.     Right: Normal. No mass or tenderness.     Left: Normal. No mass or tenderness.  Abdominal:     General: Abdomen is flat. Bowel sounds are normal.     Palpations: Abdomen is soft.  Genitourinary:    General: Normal vulva.     Exam position: Lithotomy position.     Tanner stage (genital): 5.     Vagina: Normal.     Cervix:  Normal.     Uterus: Normal.      Adnexa: Right adnexa normal and left adnexa normal.     Rectum: Normal.  Lymphadenopathy:     Upper Body:     Right upper body: No axillary adenopathy.     Left upper body: No axillary adenopathy.  Neurological:     General: No focal deficit present.     Mental Status: She is alert and oriented to person, place, and time.  Psychiatric:        Mood and Affect: Mood and affect normal.   CERUMEN IMPACTION IRRIGATION:   After verbal consent was obtained, the patient's EAC's were irrigated with a mixture of water and hydrogen peroxide. Wax was extruded with this method BL however TM's were not visualized after 2 irrigation attempts and attempt at manual extraction.   No results found for any visits on 03/04/22.     Assessment & Plan:    Routine Health Maintenance and Physical Exam  Immunization History  Administered Date(s) Administered   Influenza Whole 01/03/2010   Influenza,inj,Quad PF,6+ Mos 12/14/2016, 02/01/2018, 12/03/2021   Moderna Sars-Covid-2 Vaccination 08/04/2019, 10/04/2019   Td 11/03/2009   Tdap 05/02/2012   Zoster Recombinat (Shingrix) 12/03/2021, 03/04/2022    Health Maintenance  Topic Date Due   PAP SMEAR-Modifier  06/28/2020   COLONOSCOPY (Pts 45-55yrs Insurance coverage will need to be confirmed)  03/04/2022 (Originally 01/22/2017)   COVID-19 Vaccine (3 - 2023-24 season) 03/20/2022 (Originally 12/04/2021)   Hepatitis C Screening  12/04/2022 (Originally 04/08/1978)   HIV Screening  12/04/2022 (Originally 04/09/1975)   DTaP/Tdap/Td (3 - Td or Tdap) 05/02/2022   MAMMOGRAM  06/29/2022   INFLUENZA VACCINE  Completed   Zoster Vaccines- Shingrix  Completed   HPV VACCINES  Aged Out    Discussed health benefits of physical activity, and encouraged her to engage in regular exercise appropriate for her age and condition.  Problem List Items Addressed This Visit       Unprioritized   Essential hypertension - Primary    BP remains  elevated today, will increase her diovan to 320/25 mg 1 tablet daily and continue the amlodipine 10 mg daily. I will see her back in 3 months for repeat BP check. She will continue to check her BP daily at home.       Relevant Medications   valsartan-hydrochlorothiazide (DIOVAN-HCT) 320-25 MG tablet   Insomnia    Pt is now taking a whole tablet of ambien 10 mg at bedtime which is working well for her, she is  now sleeping through the night without awakening. We talked again about eventually transitioning her to a different medication for sleep. Will refill her medication today.      Relevant Medications   zolpidem (AMBIEN) 10 MG tablet   Healthy adult on routine physical examination    Normal physical exam findings today, pt states she is scheduled for her mammogram and colonoscopy after the first of the year. 2nd shingles due to today.      Cerumen impaction    BL ear, s/p irrigation and attempted manual removal with ear curette today in office. Impactions were deep and some wax was removed, however TM's could not be visualized. I recommended Debrox ear drops every night to help soften the wax and help it extrude on its own. If she continues to have problems she should return to office for further irrigation.      Other Visit Diagnoses     Immunization due       Relevant Orders   Zoster Recombinant (Shingrix ) (Completed)   Encounter for Papanicolaou smear of cervix       Relevant Orders   Cytology - PAP      Return in about 3 months (around 06/03/2022) for BP follow up.     Karie Georges, MD

## 2022-03-04 NOTE — Assessment & Plan Note (Signed)
Normal physical exam findings today, pt states she is scheduled for her mammogram and colonoscopy after the first of the year. 2nd shingles due to today.

## 2022-03-04 NOTE — Assessment & Plan Note (Addendum)
Pt is now taking a whole tablet of ambien 10 mg at bedtime which is working well for her, she is now sleeping through the night without awakening. We talked again about eventually transitioning her to a different medication for sleep. Will refill her medication today.

## 2022-03-04 NOTE — Assessment & Plan Note (Signed)
BP remains elevated today, will increase her diovan to 320/25 mg 1 tablet daily and continue the amlodipine 10 mg daily. I will see her back in 3 months for repeat BP check. She will continue to check her BP daily at home.

## 2022-03-05 LAB — CYTOLOGY - PAP
Adequacy: ABSENT
Chlamydia: NEGATIVE
Comment: NEGATIVE
Comment: NEGATIVE
Comment: NEGATIVE
Comment: NORMAL
Diagnosis: NEGATIVE
High risk HPV: NEGATIVE
Neisseria Gonorrhea: NEGATIVE
Trichomonas: POSITIVE — AB

## 2022-03-08 ENCOUNTER — Encounter: Payer: Self-pay | Admitting: Family Medicine

## 2022-03-08 MED ORDER — METRONIDAZOLE 500 MG PO TABS: 500.0000 mg | ORAL_TABLET | Freq: Two times a day (BID) | ORAL | 0 refills | Status: AC

## 2022-03-08 NOTE — Addendum Note (Signed)
Addended by: Johnella Moloney on: 03/08/2022 03:06 PM   Modules accepted: Orders

## 2022-03-08 NOTE — Progress Notes (Signed)
Sorry I meant 500 mg BID for 7 days

## 2022-07-13 ENCOUNTER — Other Ambulatory Visit: Payer: Self-pay | Admitting: Family Medicine

## 2022-07-13 DIAGNOSIS — G4700 Insomnia, unspecified: Secondary | ICD-10-CM

## 2022-08-12 ENCOUNTER — Other Ambulatory Visit: Payer: Self-pay | Admitting: Family Medicine

## 2022-08-12 DIAGNOSIS — G4700 Insomnia, unspecified: Secondary | ICD-10-CM

## 2022-08-12 NOTE — Telephone Encounter (Signed)
I can fill 30 days but she needs to schedule a 6 month follow up---please let patient know that to continue this medication she will need an appointment

## 2022-08-12 NOTE — Telephone Encounter (Signed)
Noted  

## 2022-08-20 ENCOUNTER — Telehealth: Payer: Self-pay

## 2022-08-20 DIAGNOSIS — Z1239 Encounter for other screening for malignant neoplasm of breast: Secondary | ICD-10-CM

## 2022-08-20 NOTE — Telephone Encounter (Signed)
I spoke with the patient and she would like Mammogram ordered to satisfy Breast cancer screening requirement.

## 2022-08-23 NOTE — Telephone Encounter (Signed)
Orders placed.

## 2022-08-23 NOTE — Telephone Encounter (Signed)
Ok to place order 

## 2022-09-02 ENCOUNTER — Encounter: Payer: Self-pay | Admitting: Family Medicine

## 2022-09-02 ENCOUNTER — Ambulatory Visit: Payer: BC Managed Care – PPO | Admitting: Family Medicine

## 2022-09-02 VITALS — BP 160/80 | HR 83 | Temp 98.6°F | Ht 67.0 in | Wt 204.7 lb

## 2022-09-02 DIAGNOSIS — F17200 Nicotine dependence, unspecified, uncomplicated: Secondary | ICD-10-CM | POA: Diagnosis not present

## 2022-09-02 DIAGNOSIS — F3341 Major depressive disorder, recurrent, in partial remission: Secondary | ICD-10-CM | POA: Diagnosis not present

## 2022-09-02 DIAGNOSIS — L309 Dermatitis, unspecified: Secondary | ICD-10-CM | POA: Diagnosis not present

## 2022-09-02 DIAGNOSIS — I10 Essential (primary) hypertension: Secondary | ICD-10-CM | POA: Diagnosis not present

## 2022-09-02 DIAGNOSIS — F321 Major depressive disorder, single episode, moderate: Secondary | ICD-10-CM | POA: Insufficient documentation

## 2022-09-02 DIAGNOSIS — G4700 Insomnia, unspecified: Secondary | ICD-10-CM

## 2022-09-02 MED ORDER — VALSARTAN-HYDROCHLOROTHIAZIDE 320-25 MG PO TABS: 1.0000 | ORAL_TABLET | Freq: Every day | ORAL | 1 refills | Status: DC

## 2022-09-02 MED ORDER — DESONIDE 0.05 % EX CREA: TOPICAL_CREAM | Freq: Two times a day (BID) | CUTANEOUS | 0 refills | Status: DC | PRN

## 2022-09-02 MED ORDER — AMLODIPINE BESYLATE 10 MG PO TABS: 10.0000 mg | ORAL_TABLET | Freq: Every day | ORAL | 1 refills | Status: DC

## 2022-09-02 MED ORDER — BUPROPION HCL ER (SR) 150 MG PO TB12: ORAL_TABLET | ORAL | 3 refills | Status: DC

## 2022-09-02 MED ORDER — ZOLPIDEM TARTRATE 10 MG PO TABS: 10.0000 mg | ORAL_TABLET | Freq: Every day | ORAL | 5 refills | Status: DC

## 2022-09-02 MED ORDER — METOPROLOL SUCCINATE ER 25 MG PO TB24
25.0000 mg | ORAL_TABLET | Freq: Every day | ORAL | 1 refills | Status: DC
Start: 2022-09-02 — End: 2023-03-21

## 2022-09-02 MED ORDER — SERTRALINE HCL 100 MG PO TABS: 100.0000 mg | ORAL_TABLET | Freq: Every day | ORAL | 1 refills | Status: DC

## 2022-09-02 NOTE — Assessment & Plan Note (Addendum)
Pt reports she is currently in remission with the sertraline, will continue 100 mg daily. She is only taking the wellbutrin 150 mg once a day but states that it is working well for her. Continue medication as prescribed.

## 2022-09-02 NOTE — Progress Notes (Signed)
Established Patient Office Visit  Subjective   Patient ID: Alexandria Cruz, female    DOB: September 05, 1960  Age: 62 y.o. MRN: [id number]  Chief Complaint  Patient presents with   Medical Management of Chronic Issues    Pt is here for follow up of HTN  HTN-- BP performed in office today and is elevated. She denies any chest pain, SOB or headaches. States she Is getting about the same readings at home as well. We discussed getting a renal artery [place] to rule out RAS and she is agreeable. We discussed adding metoprolol and she is agreeable.   Insomnia-- pt states she is getting about 6 hours of sleep at night, she is taking the full 10 mg of ambien now, states this is working a bit better for her. She would like to continue the prescription.       Current Outpatient Medications  Medication Instructions   amLODipine (NORVASC) 10 mg, Oral, Daily at bedtime   buPROPion (WELLBUTRIN SR) 150 MG 12 hr tablet TAKE ONE TABLET BY MOUTH ONCE DAILY FOR SEVEN DAYS THEN INCREASE TO ONE TABLET TWICE DAILY   desonide (DESOWEN) 0.05 % cream Topical, 2 times daily PRN   metoprolol succinate (TOPROL-XL) 25 mg, Oral, Daily at bedtime   sertraline (ZOLOFT) 100 mg, Oral, Daily   valsartan-hydrochlorothiazide (DIOVAN-HCT) 320-25 MG tablet 1 tablet, Oral, Daily   zolpidem (AMBIEN) 10 mg, Oral, Daily at bedtime    Patient Active Problem List   Diagnosis Date Noted   Depression, major, single episode, moderate (HCC) 09/02/2022   Healthy adult on routine physical examination 03/04/2022   Cerumen impaction 03/04/2022   Screening for cervical cancer 06/28/2017   Depression, major, recurrent, in partial remission (HCC) 12/14/2016   Insomnia 04/15/2014   Hyperlipidemia, mixed 02/17/2011   Menopausal symptoms 02/04/2011   Essential hypertension 02/24/2010   Allergies 02/24/2010      Review of Systems  All other systems reviewed and are negative.     Objective:     BP (!) 160/80 (BP Location: Right  Arm, Patient Position: Sitting, Cuff Size: Large)   Pulse 83   Temp 98.6 F (37 C) (Oral)   Ht 5\' 7"  (1.702 m)   Wt 204 lb 11.2 oz (92.9 kg)   LMP 10/28/2010   SpO2 96%   BMI 32.06 kg/m    Physical Exam Constitutional:      Appearance: Normal appearance. She is obese.  Eyes:     Conjunctiva/sclera: Conjunctivae normal.  Neck:     Thyroid: No thyromegaly.  Cardiovascular:     Rate and Rhythm: Normal rate and regular rhythm.     Pulses: Normal pulses.     Heart sounds: Normal heart sounds. No murmur heard. Pulmonary:     Effort: Pulmonary effort is normal.     Breath sounds: Normal breath sounds. No wheezing or rales.  Musculoskeletal:     Right lower leg: No edema.     Left lower leg: No edema.  Neurological:     Mental Status: She is alert and oriented to person, place, and time. Mental status is at baseline.  Psychiatric:        Mood and Affect: Mood normal.        Behavior: Behavior normal.      No results found for any visits on 09/02/22.    The 10-year ASCVD risk score (Arnett DK, et al., 2019) is: 14.4%    Assessment & Plan:  Depression, major, recurrent, in partial remission (  HCC) Assessment & Plan: Pt reports she is currently in remission with the sertraline, will continue 100 mg daily. She is only taking the wellbutrin 150 mg once a day but states that it is working well for her. Continue medication as prescribed.   Orders: -     Sertraline HCl; Take 1 tablet (100 mg total) by mouth daily.  Dispense: 90 tablet; Refill: 1  Essential hypertension Assessment & Plan: Current hypertension medications:       Sig   metoprolol succinate (TOPROL-XL) 25 MG 24 hr tablet (Taking) Take 1 tablet (25 mg total) by mouth at bedtime.   amLODipine (NORVASC) 10 MG tablet Take 1 tablet (10 mg total) by mouth at bedtime.   valsartan-hydrochlorothiazide (DIOVAN-HCT) 320-25 MG tablet Take 1 tablet by mouth daily.      Adding metoprolol today and ordering renal artery  [place] to rule out other causes. Patient continue to smoke 1/2 ppd which could also be elevating her blood pressure.   Orders: -     amLODIPine Besylate; Take 1 tablet (10 mg total) by mouth at bedtime.  Dispense: 90 tablet; Refill: 1 -     Valsartan-hydroCHLOROthiazide; Take 1 tablet by mouth daily.  Dispense: 90 tablet; Refill: 1 -     VAS US RENAL ARTERY DUPLEX; Future -     Metoprolol Succinate ER; Take 1 tablet (25 mg total) by mouth at bedtime.  Dispense: 90 tablet; Refill: 1  Tobacco dependence -     buPROPion HCl ER (SR); TAKE ONE TABLET BY MOUTH ONCE DAILY FOR SEVEN DAYS THEN INCREASE TO ONE TABLET TWICE DAILY  Dispense: 180 tablet; Refill: 3  Dermatitis -     Desonide; Apply topically 2 (two) times daily as needed.  Dispense: 30 g; Refill: 0  Insomnia, unspecified type Assessment & Plan: Pt is now taking a whole tablet of ambien 10 mg at bedtime which is working well for her, she is now sleeping through the night without awakening.  Will refill her medication today.  Orders: -     Zolpidem Tartrate; Take 1 tablet (10 mg total) by mouth at bedtime.  Dispense: 30 tablet; Refill: 5     Return in about 3 months (around 12/03/2022) for HTN.    Karie Georges, MD

## 2022-09-02 NOTE — Assessment & Plan Note (Signed)
Current hypertension medications:       Sig   metoprolol succinate (TOPROL-XL) 25 MG 24 hr tablet (Taking) Take 1 tablet (25 mg total) by mouth at bedtime.   amLODipine (NORVASC) 10 MG tablet Take 1 tablet (10 mg total) by mouth at bedtime.   valsartan-hydrochlorothiazide (DIOVAN-HCT) 320-25 MG tablet Take 1 tablet by mouth daily.      Adding metoprolol today and ordering renal artery [place] to rule out other causes. Patient continue to smoke 1/2 ppd which could also be elevating her blood pressure.

## 2022-09-02 NOTE — Assessment & Plan Note (Signed)
Pt is now taking a whole tablet of ambien 10 mg at bedtime which is working well for her, she is now sleeping through the night without awakening.  Will refill her medication today.

## 2022-09-08 ENCOUNTER — Telehealth (HOSPITAL_BASED_OUTPATIENT_CLINIC_OR_DEPARTMENT_OTHER): Payer: Self-pay | Admitting: *Deleted

## 2022-09-08 NOTE — Telephone Encounter (Signed)
Left message for patient to call and schedule the Renal artery duplex ordered by Dr. Nira Conn

## 2022-09-10 ENCOUNTER — Ambulatory Visit
Admission: RE | Admit: 2022-09-10 | Discharge: 2022-09-10 | Disposition: A | Discharge status: Home / Self Care | Payer: BC Managed Care – PPO | Source: Ambulatory Visit | Attending: Family Medicine | Admitting: Family Medicine

## 2022-09-10 DIAGNOSIS — Z1239 Encounter for other screening for malignant neoplasm of breast: Secondary | ICD-10-CM

## 2022-09-10 DIAGNOSIS — Z1231 Encounter for screening mammogram for malignant neoplasm of breast: Secondary | ICD-10-CM | POA: Diagnosis not present

## 2022-09-13 NOTE — Telephone Encounter (Signed)
Left message for patient to call and schedule the renal artery duplex ordered by Dr. Nira Conn

## 2022-09-15 ENCOUNTER — Encounter (HOSPITAL_BASED_OUTPATIENT_CLINIC_OR_DEPARTMENT_OTHER): Payer: Self-pay | Admitting: *Deleted

## 2022-09-15 NOTE — Telephone Encounter (Signed)
Left message for patient to call and discuss scheduling the renal duplex ordered by Dr. Vladimir Faster also mail letter requesting she call

## 2022-11-04 ENCOUNTER — Telehealth: Payer: BC Managed Care – PPO | Admitting: Family Medicine

## 2022-11-04 ENCOUNTER — Encounter: Payer: Self-pay | Admitting: Family Medicine

## 2022-11-04 VITALS — Temp 100.5°F | Ht 67.0 in | Wt 204.0 lb

## 2022-11-04 DIAGNOSIS — U071 COVID-19: Secondary | ICD-10-CM | POA: Diagnosis not present

## 2022-11-04 MED ORDER — NIRMATRELVIR/RITONAVIR (PAXLOVID)TABLET
3.0000 | ORAL_TABLET | Freq: Two times a day (BID) | ORAL | 0 refills | Status: AC
Start: 2022-11-04 — End: 2022-11-09

## 2022-11-04 NOTE — Progress Notes (Signed)
Virtual Visit via Video Note  I connected with Alexandria Cruz on 11/04/22 at  1:00 PM EDT by a video enabled telemedicine application and verified that I am speaking with the correct person using two identifiers.  Location patient: home Location provider:work or home office Persons participating in the virtual visit: patient, provider  I discussed the limitations of evaluation and management by telemedicine and the availability of in person appointments. The patient expressed understanding and agreed to proceed.  Chief Complaint  Patient presents with   Covid Positive    Pt reports sx bad headache, fever (100.5), no sore throat, some body ache, fatigue. Sx started last night. Had exposure to son and husband. Taking motrin.    HPI: Patient is a 62 year old female followed with Dr. Casimiro Needle and seen for acute concern.  Pt woke up last night/this morning with HA and fever.  Now Throat feels full, feels tired, has mild rhinorrhea, loose stools, and no appetite.  Denies cough, n/v.  Tried Motrin for symptoms.  Sick contacts include patient's husband and son who both have COVID.  ROS: See pertinent positives and negatives per HPI.  Past Medical History:  Diagnosis Date   Depression    Endometriosis    Hypertension    Smoker    1/2 PPD    Past Surgical History:  Procedure Laterality Date   CESAREAN SECTION     X3   COLONOSCOPY  2008   PELVIC LAPAROSCOPY  2000   ENDOMETRIOSIS   TUBAL LIGATION      Family History  Problem Relation Age of Onset   Hypertension Mother    Cancer Mother 19       COLON   Colon cancer Mother    Colon polyps Mother    Cancer Maternal Grandmother        OVARIAN   Colon polyps Sister    Colon polyps Brother    Esophageal cancer Neg Hx    Rectal cancer Neg Hx    Stomach cancer Neg Hx       Current Outpatient Medications:    amLODipine (NORVASC) 10 MG tablet, Take 1 tablet (10 mg total) by mouth at bedtime., Disp: 90 tablet, Rfl: 1   buPROPion  (WELLBUTRIN SR) 150 MG 12 hr tablet, TAKE ONE TABLET BY MOUTH ONCE DAILY FOR SEVEN DAYS THEN INCREASE TO ONE TABLET TWICE DAILY, Disp: 180 tablet, Rfl: 3   desonide (DESOWEN) 0.05 % cream, Apply topically 2 (two) times daily as needed., Disp: 30 g, Rfl: 0   metoprolol succinate (TOPROL-XL) 25 MG 24 hr tablet, Take 1 tablet (25 mg total) by mouth at bedtime., Disp: 90 tablet, Rfl: 1   sertraline (ZOLOFT) 100 MG tablet, Take 1 tablet (100 mg total) by mouth daily., Disp: 90 tablet, Rfl: 1   valsartan-hydrochlorothiazide (DIOVAN-HCT) 320-25 MG tablet, Take 1 tablet by mouth daily., Disp: 90 tablet, Rfl: 1   zolpidem (AMBIEN) 10 MG tablet, Take 1 tablet (10 mg total) by mouth at bedtime., Disp: 30 tablet, Rfl: 5  EXAM:  VITALS per patient if applicable:  RR between 12-20 bpm  GENERAL: alert, oriented, appears well and in no acute distress  HEENT: atraumatic, conjunctiva clear, no obvious abnormalities on inspection of external nose and ears  NECK: normal movements of the head and neck  LUNGS: on inspection no signs of respiratory distress, breathing rate appears normal, no obvious gross SOB, gasping or wheezing  CV: no obvious cyanosis  MS: moves all visible extremities without noticeable abnormality  PSYCH/NEURO:  pleasant and cooperative, no obvious depression or anxiety, speech and thought processing grossly intact  ASSESSMENT AND PLAN:  Discussed the following assessment and plan:  COVID-19 virus infection - Plan: nirmatrelvir/ritonavir (PAXLOVID) 20 x 150 MG & 10 x 100MG  TABS Acute viral symptoms starting last night/this morning with positive COVID test today.  Discussed r/b/a of antiviral medications.  Patient wishes to start medication.  Continue supportive care given strict precautions.  Follow-up as needed   I discussed the assessment and treatment plan with the patient. The patient was provided an opportunity to ask questions and all were answered. The patient agreed with the  plan and demonstrated an understanding of the instructions.   The patient was advised to call back or seek an in-person evaluation if the symptoms worsen or if the condition fails to improve as anticipated.   Deeann Saint, MD

## 2023-01-07 ENCOUNTER — Other Ambulatory Visit: Payer: Self-pay | Admitting: Family Medicine

## 2023-01-07 DIAGNOSIS — Z1211 Encounter for screening for malignant neoplasm of colon: Secondary | ICD-10-CM

## 2023-01-07 DIAGNOSIS — Z1212 Encounter for screening for malignant neoplasm of rectum: Secondary | ICD-10-CM

## 2023-02-04 DIAGNOSIS — Z1212 Encounter for screening for malignant neoplasm of rectum: Secondary | ICD-10-CM | POA: Diagnosis not present

## 2023-02-04 DIAGNOSIS — Z1211 Encounter for screening for malignant neoplasm of colon: Secondary | ICD-10-CM | POA: Diagnosis not present

## 2023-02-11 LAB — COLOGUARD: COLOGUARD: NEGATIVE

## 2023-02-28 ENCOUNTER — Other Ambulatory Visit: Payer: Self-pay | Admitting: Family Medicine

## 2023-02-28 DIAGNOSIS — G4700 Insomnia, unspecified: Secondary | ICD-10-CM

## 2023-02-28 NOTE — Telephone Encounter (Signed)
Patient called back, was informed of the message below and an appt was scheduled for 12/16.  Message sent to PCP.

## 2023-02-28 NOTE — Telephone Encounter (Signed)
Pt needs an appointment, please schedule it then send this back to me and I will fill enough until her appointment

## 2023-02-28 NOTE — Telephone Encounter (Signed)
Left a message for the patient to return my call.

## 2023-03-21 ENCOUNTER — Ambulatory Visit: Payer: BC Managed Care – PPO | Admitting: Family Medicine

## 2023-03-21 ENCOUNTER — Encounter: Payer: Self-pay | Admitting: Family Medicine

## 2023-03-21 VITALS — BP 152/90 | HR 73 | Temp 98.3°F | Ht 67.0 in | Wt 212.6 lb

## 2023-03-21 DIAGNOSIS — Z23 Encounter for immunization: Secondary | ICD-10-CM

## 2023-03-21 DIAGNOSIS — G4700 Insomnia, unspecified: Secondary | ICD-10-CM | POA: Diagnosis not present

## 2023-03-21 DIAGNOSIS — F3341 Major depressive disorder, recurrent, in partial remission: Secondary | ICD-10-CM | POA: Diagnosis not present

## 2023-03-21 DIAGNOSIS — I10 Essential (primary) hypertension: Secondary | ICD-10-CM | POA: Diagnosis not present

## 2023-03-21 DIAGNOSIS — F17200 Nicotine dependence, unspecified, uncomplicated: Secondary | ICD-10-CM

## 2023-03-21 DIAGNOSIS — E782 Mixed hyperlipidemia: Secondary | ICD-10-CM | POA: Diagnosis not present

## 2023-03-21 LAB — COMPREHENSIVE METABOLIC PANEL
ALT: 25 U/L (ref 0–35)
AST: 16 U/L (ref 0–37)
Albumin: 4.2 g/dL (ref 3.5–5.2)
Alkaline Phosphatase: 103 U/L (ref 39–117)
BUN: 17 mg/dL (ref 6–23)
CO2: 26 meq/L (ref 19–32)
Calcium: 8.9 mg/dL (ref 8.4–10.5)
Chloride: 103 meq/L (ref 96–112)
Creatinine, Ser: 0.8 mg/dL (ref 0.40–1.20)
GFR: 78.68 mL/min (ref >60.00)
Glucose, Bld: 88 mg/dL (ref 70–99)
Potassium: 4.2 meq/L (ref 3.5–5.1)
Sodium: 137 meq/L (ref 135–145)
Total Bilirubin: 0.5 mg/dL (ref 0.2–1.2)
Total Protein: 6.3 g/dL (ref 6.0–8.3)

## 2023-03-21 LAB — LIPID PANEL
Cholesterol: 197 mg/dL (ref 0–200)
HDL: 70.4 mg/dL (ref >39.00)
LDL Cholesterol: 107 mg/dL — ABNORMAL HIGH (ref 0–99)
NonHDL: 126.11
Total CHOL/HDL Ratio: 3
Triglycerides: 96 mg/dL (ref 0.0–149.0)
VLDL: 19.2 mg/dL (ref 0.0–40.0)

## 2023-03-21 MED ORDER — VALSARTAN-HYDROCHLOROTHIAZIDE 320-25 MG PO TABS
1.0000 | ORAL_TABLET | Freq: Every day | ORAL | 1 refills | Status: AC
Start: 2023-03-21 — End: ?

## 2023-03-21 MED ORDER — AMLODIPINE BESYLATE 10 MG PO TABS
10.0000 mg | ORAL_TABLET | Freq: Every day | ORAL | 1 refills | Status: AC
Start: 2023-03-21 — End: ?

## 2023-03-21 MED ORDER — SERTRALINE HCL 100 MG PO TABS
100.0000 mg | ORAL_TABLET | Freq: Every day | ORAL | 1 refills | Status: AC
Start: 2023-03-21 — End: ?

## 2023-03-21 MED ORDER — METOPROLOL SUCCINATE ER 25 MG PO TB24
25.0000 mg | ORAL_TABLET | Freq: Every day | ORAL | 1 refills | Status: AC
Start: 2023-03-21 — End: ?

## 2023-03-21 MED ORDER — BUPROPION HCL ER (SR) 150 MG PO TB12
ORAL_TABLET | ORAL | 3 refills | Status: AC
Start: 2023-03-21 — End: ?

## 2023-03-21 MED ORDER — BELSOMRA 10 MG PO TABS: 10.0000 mg | ORAL_TABLET | Freq: Every evening | ORAL | 2 refills | Status: DC | PRN

## 2023-03-21 NOTE — Assessment & Plan Note (Addendum)
BP remains elevated today however pt states that her BP at home is in the normal range. I advised the patient to continue checking blood pressure at home daily, will not make any changes to her medication at this time since her home readings are WNL. She is due for CMP and Lipid panel

## 2023-03-21 NOTE — Progress Notes (Signed)
Established Patient Office Visit  Subjective   Patient ID: Alexandria Cruz, female    DOB: 1960-12-20  Age: 62 y.o. MRN: [id number]  Chief Complaint  Patient presents with   Medical Management of Chronic Issues    Pt is here for her 6 month follow up.   Chronic insomnia-- patient is reporting that the Remus Loffler is no longer effective for her. She has been on this medication for about 15 years. States that she has tried trazodone in the past which was also ineffective. Has tried all of the OTC  sleep aids without any improvement. Pt states that when she takes the [place] she is still wide awake and when she does fall asleep she only gets about 2 hours. States that her husband has been diagnosed with cancer, happened in August and since then she is having a lot of trouble sleeping.    HTN-- pt reports that she usually has good blood pressure readings at home. She denies any headache, chest pain or SOB. BP is elevated in office, pt states she is under a lot of stress right now due to her husband's diagnosis.  Pt needs refills on her medications today for her depression as well, states that her symptoms are well controlled except for the sleeping issues which accounts for the score of 3.   Flowsheet Row Office Visit from 03/21/2023 in [facility] [place]  PHQ-9 Total Score 3        Current Outpatient Medications  Medication Instructions   amLODipine (NORVASC) 10 mg, Oral, Daily at bedtime   Belsomra 10 mg, Oral, At bedtime PRN   buPROPion (WELLBUTRIN SR) 150 MG 12 hr tablet TAKE ONE TABLET BY MOUTH ONCE DAILY FOR SEVEN DAYS THEN INCREASE TO ONE TABLET TWICE DAILY   desonide (DESOWEN) 0.05 % cream Topical, 2 times daily PRN   metoprolol succinate (TOPROL-XL) 25 mg, Oral, Daily at bedtime   sertraline (ZOLOFT) 100 mg, Oral, Daily   valsartan-hydrochlorothiazide (DIOVAN-HCT) 320-25 MG tablet 1 tablet, Oral, Daily   zolpidem (AMBIEN) 10 mg, Oral, Daily at bedtime     Patient Active Problem List   Diagnosis Date Noted   Depression, major, single episode, moderate (HCC) 09/02/2022   Healthy adult on routine physical examination 03/04/2022   Cerumen impaction 03/04/2022   Screening for cervical cancer 06/28/2017   Depression, major, recurrent, in partial remission (HCC) 12/14/2016   Insomnia 04/15/2014   Hyperlipidemia, mixed 02/17/2011   Menopausal symptoms 02/04/2011   Essential hypertension 02/24/2010   Allergies 02/24/2010      Review of Systems  All other systems reviewed and are negative.     Objective:     BP (!) 152/90   Pulse 73   Temp 98.3 F (36.8 C) (Oral)   Ht 5\' 7"  (1.702 m)   Wt 212 lb 9.6 oz (96.4 kg)   LMP 10/28/2010   SpO2 99%   BMI 33.30 kg/m    Physical Exam Vitals reviewed.  Constitutional:      Appearance: Normal appearance. She is well-groomed. She is obese.  Eyes:     Conjunctiva/sclera: Conjunctivae normal.  Neck:     Thyroid: No thyromegaly.  Cardiovascular:     Rate and Rhythm: Normal rate and regular rhythm.     Pulses: Normal pulses.     Heart sounds: S1 normal and S2 normal.  Pulmonary:     Effort: Pulmonary effort is normal.     Breath sounds: Normal breath sounds and air entry.  Musculoskeletal:     Right lower leg: No edema.     Left lower leg: No edema.  Neurological:     Mental Status: She is alert and oriented to person, place, and time. Mental status is at baseline.     Gait: Gait is intact.  Psychiatric:        Mood and Affect: Mood and affect normal.        Speech: Speech normal.        Behavior: Behavior normal.        Judgment: Judgment normal.      No results found for any visits on 03/21/23.    The 10-year ASCVD risk score (Arnett DK, et al., 2019) is: 13.1%    Assessment & Plan:  Insomnia, unspecified type Assessment & Plan: 4 month history of recurrent difficulty sleeping. Ambien is no longer effective for her to sleep. Pt has tried trazodone in the past and  other OTC medication, we discussed switching her to belsomra at night, I recommended she cut her ambien in half and start the 10 mg belosmra at bedtime. She will update me in a couple of weeks to let me know if the medication is working for her.  Orders: -     Belsomra; Take 1 tablet (10 mg total) by mouth at bedtime as needed.  Dispense: 30 tablet; Refill: 2  Essential hypertension Assessment & Plan: BP remains elevated today however pt states that her BP at home is in the normal range. I advised the patient to continue checking blood pressure at home daily, will not make any changes to her medication at this time since her home readings are WNL. She is due for CMP and Lipid panel  Orders: -     amLODIPine Besylate; Take 1 tablet (10 mg total) by mouth at bedtime.  Dispense: 90 tablet; Refill: 1 -     Metoprolol Succinate ER; Take 1 tablet (25 mg total) by mouth at bedtime.  Dispense: 90 tablet; Refill: 1 -     Valsartan-hydroCHLOROthiazide; Take 1 tablet by mouth daily.  Dispense: 90 tablet; Refill: 1 -     Comprehensive metabolic panel  Tobacco dependence -     buPROPion HCl ER (SR); TAKE ONE TABLET BY MOUTH ONCE DAILY FOR SEVEN DAYS THEN INCREASE TO ONE TABLET TWICE DAILY  Dispense: 180 tablet; Refill: 3  Depression, major, recurrent, in partial remission (HCC) Assessment & Plan: PHQ reviewed with patient -- her sx are well controlled except for the sleep issue. Will continue the wellbutrin and the sertraline daily. Refills sent to pharmacy.  Orders: -     Sertraline HCl; Take 1 tablet (100 mg total) by mouth daily.  Dispense: 90 tablet; Refill: 1  Hyperlipidemia, mixed -     Lipid panel  Influenza vaccination given -     Flu vaccine trivalent PF, 6mos and older(Flulaval,Afluria,Fluarix,Fluzone)  Immunization due -     Tdap vaccine greater than or equal to 7yo IM     Return in about 6 months (around 09/19/2023).    Karie Georges, MD

## 2023-03-21 NOTE — Assessment & Plan Note (Signed)
PHQ reviewed with patient -- her sx are well controlled except for the sleep issue. Will continue the wellbutrin and the sertraline daily. Refills sent to pharmacy.

## 2023-03-21 NOTE — Assessment & Plan Note (Signed)
4 month history of recurrent difficulty sleeping. Ambien is no longer effective for her to sleep. Pt has tried trazodone in the past and other OTC medication, we discussed switching her to belsomra at night, I recommended she cut her ambien in half and start the 10 mg belosmra at bedtime. She will update me in a couple of weeks to let me know if the medication is working for her.

## 2023-03-22 ENCOUNTER — Encounter: Payer: Self-pay | Admitting: Family Medicine

## 2023-04-08 ENCOUNTER — Other Ambulatory Visit: Payer: Self-pay | Admitting: Family Medicine

## 2023-04-08 ENCOUNTER — Encounter: Payer: Self-pay | Admitting: Family Medicine

## 2023-04-08 DIAGNOSIS — G4700 Insomnia, unspecified: Secondary | ICD-10-CM

## 2023-06-28 ENCOUNTER — Other Ambulatory Visit: Payer: Self-pay | Admitting: Family Medicine

## 2023-06-28 DIAGNOSIS — G4700 Insomnia, unspecified: Secondary | ICD-10-CM

## 2023-08-22 ENCOUNTER — Other Ambulatory Visit: Payer: Self-pay | Admitting: Family Medicine

## 2023-08-22 DIAGNOSIS — Z1231 Encounter for screening mammogram for malignant neoplasm of breast: Secondary | ICD-10-CM

## 2023-09-12 ENCOUNTER — Ambulatory Visit
Admission: RE | Admit: 2023-09-12 | Discharge: 2023-09-12 | Disposition: A | Discharge status: Home / Self Care | Source: Ambulatory Visit | Attending: Family Medicine | Admitting: Family Medicine

## 2023-09-12 DIAGNOSIS — Z1231 Encounter for screening mammogram for malignant neoplasm of breast: Secondary | ICD-10-CM | POA: Diagnosis not present

## 2023-09-16 ENCOUNTER — Ambulatory Visit: Payer: Self-pay | Admitting: Family Medicine

## 2023-09-20 ENCOUNTER — Ambulatory Visit: Payer: BC Managed Care – PPO | Admitting: Family Medicine

## 2023-09-20 ENCOUNTER — Encounter: Payer: Self-pay | Admitting: Family Medicine

## 2023-09-20 DIAGNOSIS — L309 Dermatitis, unspecified: Secondary | ICD-10-CM | POA: Diagnosis not present

## 2023-09-20 DIAGNOSIS — G4700 Insomnia, unspecified: Secondary | ICD-10-CM

## 2023-09-20 DIAGNOSIS — F3341 Major depressive disorder, recurrent, in partial remission: Secondary | ICD-10-CM

## 2023-09-20 DIAGNOSIS — F17200 Nicotine dependence, unspecified, uncomplicated: Secondary | ICD-10-CM | POA: Diagnosis not present

## 2023-09-20 DIAGNOSIS — I10 Essential (primary) hypertension: Secondary | ICD-10-CM

## 2023-09-20 MED ORDER — SERTRALINE HCL 100 MG PO TABS: 100.0000 mg | ORAL_TABLET | Freq: Every day | ORAL | 1 refills | Status: DC

## 2023-09-20 MED ORDER — DESONIDE 0.05 % EX CREA: TOPICAL_CREAM | Freq: Two times a day (BID) | CUTANEOUS | 0 refills | Status: DC | PRN

## 2023-09-20 MED ORDER — VALSARTAN-HYDROCHLOROTHIAZIDE 320-25 MG PO TABS: 1.0000 | ORAL_TABLET | Freq: Every day | ORAL | 1 refills | Status: DC

## 2023-09-20 MED ORDER — DOXEPIN HCL 3 MG PO TABS: 3.0000 mg | ORAL_TABLET | Freq: Every day | ORAL | 1 refills | Status: DC

## 2023-09-20 MED ORDER — METOPROLOL SUCCINATE ER 25 MG PO TB24: 25.0000 mg | ORAL_TABLET | Freq: Every day | ORAL | 1 refills | Status: DC

## 2023-09-20 MED ORDER — BUPROPION HCL ER (SR) 150 MG PO TB12: ORAL_TABLET | ORAL | 3 refills | Status: DC

## 2023-09-20 MED ORDER — ZOLPIDEM TARTRATE 10 MG PO TABS: 10.0000 mg | ORAL_TABLET | Freq: Every day | ORAL | 2 refills | Status: DC

## 2023-09-20 MED ORDER — AMLODIPINE BESYLATE 10 MG PO TABS: 10.0000 mg | ORAL_TABLET | Freq: Every day | ORAL | 1 refills | Status: DC

## 2023-09-20 NOTE — Assessment & Plan Note (Signed)
 Chronic, stable. BP today is well controlled, continue the medications listed below, refills sent to pharmacy.

## 2023-09-20 NOTE — Progress Notes (Signed)
 Established Patient Office Visit  Subjective   Patient ID: Alexandria Cruz, female    DOB: [birth year]  Age: 63 y.o. MRN: [id number]  Chief Complaint  Patient presents with   Medical Management of Chronic Issues    Pt is here for follow up of her chronic medical problems.  Chronic insomnia-- pt reports that the belsomra  did not work for her, even with the ambien . States that she has continued to take the ambien  but still she has trouble with falling asleep and staying asleep. We discussed trying doxepin at bedtime and pt is agreeable-- risks/benefits.   HTN -- BP in office performed and is well controlled. She  reports no side effects to the medications, no chest pain, SOB, dizziness or headaches. She has a BP cuff at home and is checking BP regularly, reports they are in the normal range.   Reviewed health maintenance-- she is UTD on preventative screenings     Current Outpatient Medications  Medication Instructions   amLODipine  (NORVASC ) 10 mg, Oral, Daily at bedtime   buPROPion  (WELLBUTRIN  SR) 150 MG 12 hr tablet TAKE ONE TABLET BY MOUTH ONCE DAILY FOR SEVEN DAYS THEN INCREASE TO ONE TABLET TWICE DAILY   desonide  (DESOWEN ) 0.05 % cream Topical, 2 times daily PRN   Doxepin HCl 3-6 mg, Oral, Daily at bedtime   metoprolol  succinate (TOPROL -XL) 25 mg, Oral, Daily at bedtime   sertraline  (ZOLOFT ) 100 mg, Oral, Daily   valsartan -hydrochlorothiazide  (DIOVAN -HCT) 320-25 MG tablet 1 tablet, Oral, Daily   zolpidem  (AMBIEN ) 10 mg, Oral, Daily at bedtime    Patient Active Problem List   Diagnosis Date Noted   Depression, major, single episode, moderate (HCC) 09/02/2022   Healthy adult on routine physical examination 03/04/2022   Cerumen impaction 03/04/2022   Screening for cervical cancer 06/28/2017   Depression, major, recurrent, in partial remission (HCC) 12/14/2016   Insomnia 04/15/2014   Hyperlipidemia, mixed 02/17/2011   Menopausal symptoms 02/04/2011   Tobacco dependence  02/24/2010   Essential hypertension 02/24/2010   Allergies 02/24/2010      Review of Systems  All other systems reviewed and are negative.     Objective:     BP 122/72   Pulse 67   Temp 98.5 F (36.9 C) (Oral)   Ht 5' 7 (1.702 m)   Wt 220 lb 3.2 oz (99.9 kg)   LMP 10/28/2010   SpO2 97%   BMI 34.49 kg/m    Physical Exam Vitals reviewed.  Constitutional:      Appearance: Normal appearance. She is well-groomed. She is obese.   Cardiovascular:     Rate and Rhythm: Normal rate and regular rhythm.     Pulses: Normal pulses.     Heart sounds: S1 normal and S2 normal.  Pulmonary:     Effort: Pulmonary effort is normal.     Breath sounds: Normal breath sounds and air entry.   Musculoskeletal:     Right lower leg: No edema.     Left lower leg: No edema.   Neurological:     Mental Status: She is alert and oriented to person, place, and time. Mental status is at baseline.     Gait: Gait is intact.   Psychiatric:        Mood and Affect: Mood and affect normal.        Speech: Speech normal.        Behavior: Behavior normal.        Judgment: Judgment normal.  No results found for any visits on 09/20/23.    The 10-year ASCVD risk score (Arnett DK, et al., 2019) is: 9.2%    Assessment & Plan:  Essential hypertension Assessment & Plan: Chronic, stable. BP today is well controlled, continue the medications listed below, refills sent to pharmacy.   Orders: -     amLODIPine  Besylate; Take 1 tablet (10 mg total) by mouth at bedtime.  Dispense: 90 tablet; Refill: 1 -     Metoprolol  Succinate ER; Take 1 tablet (25 mg total) by mouth at bedtime.  Dispense: 90 tablet; Refill: 1 -     Valsartan -hydroCHLOROthiazide ; Take 1 tablet by mouth daily.  Dispense: 90 tablet; Refill: 1  Tobacco dependence Assessment & Plan: Pt reports the wellbutrin  has reduce the number of cigarettes she is smoking, is down to less than 1/2 ppd per her report today. Will continue this as  prescribed.   Orders: -     buPROPion  HCl ER (SR); TAKE ONE TABLET BY MOUTH ONCE DAILY FOR SEVEN DAYS THEN INCREASE TO ONE TABLET TWICE DAILY  Dispense: 180 tablet; Refill: 3  Dermatitis -     Desonide ; Apply topically 2 (two) times daily as needed.  Dispense: 30 g; Refill: 0  Depression, major, recurrent, in partial remission (HCC) Assessment & Plan: Chronic, stable, sx controlled on the sertraline . We briefly discussed the insomnia that can be associated with sertraline . If the doxepin is not effective then will consider switching her to citalopram at the next visit.   Orders: -     Sertraline  HCl; Take 1 tablet (100 mg total) by mouth daily.  Dispense: 90 tablet; Refill: 1  Insomnia, unspecified type Assessment & Plan: Belsomra  ineffective, will give her a trial of doxepin 3-6 mg at bedtime to see if this will be more beneficial. If so then she can begin weaning her ambien  at night.   Orders: -     Zolpidem  Tartrate; Take 1 tablet (10 mg total) by mouth at bedtime.  Dispense: 30 tablet; Refill: 2 -     Doxepin HCl; Take 1-2 tablets (3-6 mg total) by mouth at bedtime.  Dispense: 60 tablet; Refill: 1     Return in about 6 months (around 03/21/2024) for annual physical exam.    Aida House, MD

## 2023-09-20 NOTE — Assessment & Plan Note (Signed)
 Pt reports the wellbutrin  has reduce the number of cigarettes she is smoking, is down to less than 1/2 ppd per her report today. Will continue this as prescribed.

## 2023-09-20 NOTE — Assessment & Plan Note (Signed)
 Belsomra  ineffective, will give her a trial of doxepin 3-6 mg at bedtime to see if this will be more beneficial. If so then she can begin weaning her ambien  at night.

## 2023-09-20 NOTE — Assessment & Plan Note (Signed)
 Chronic, stable, sx controlled on the sertraline . We briefly discussed the insomnia that can be associated with sertraline . If the doxepin is not effective then will consider switching her to citalopram at the next visit.

## 2023-10-04 NOTE — Progress Notes (Signed)
 [facility] Quality Team Note  Name: Alexandria Cruz Date of Birth: October 09, 1960 MRN: [id number] Date: 10/04/2023  [facility] Quality Team has reviewed this patient's chart, please see recommendations below:  [facility] Quality Other; (CHART REVIEWED FOR GAP CLOSURE. ABSTRACTED MOST RECENT CONTROLLING BLOOD PRESSURE.)

## 2023-12-28 ENCOUNTER — Encounter: Payer: Self-pay | Admitting: Family Medicine

## 2023-12-28 ENCOUNTER — Other Ambulatory Visit: Payer: Self-pay | Admitting: Family Medicine

## 2023-12-28 DIAGNOSIS — G4700 Insomnia, unspecified: Secondary | ICD-10-CM

## 2024-03-23 ENCOUNTER — Encounter: Payer: Self-pay | Admitting: Family Medicine

## 2024-03-23 ENCOUNTER — Other Ambulatory Visit: Payer: Self-pay | Admitting: Family Medicine

## 2024-03-23 DIAGNOSIS — G4700 Insomnia, unspecified: Secondary | ICD-10-CM

## 2024-04-12 ENCOUNTER — Encounter: Payer: Self-pay | Admitting: Family Medicine

## 2024-04-12 ENCOUNTER — Ambulatory Visit: Admitting: Family Medicine

## 2024-04-12 VITALS — BP 138/68 | HR 62 | Temp 97.8°F | Ht 67.0 in | Wt 219.2 lb

## 2024-04-12 DIAGNOSIS — Z131 Encounter for screening for diabetes mellitus: Secondary | ICD-10-CM | POA: Diagnosis not present

## 2024-04-12 DIAGNOSIS — L309 Dermatitis, unspecified: Secondary | ICD-10-CM | POA: Diagnosis not present

## 2024-04-12 DIAGNOSIS — F3341 Major depressive disorder, recurrent, in partial remission: Secondary | ICD-10-CM

## 2024-04-12 DIAGNOSIS — E782 Mixed hyperlipidemia: Secondary | ICD-10-CM

## 2024-04-12 DIAGNOSIS — Z1159 Encounter for screening for other viral diseases: Secondary | ICD-10-CM

## 2024-04-12 DIAGNOSIS — I10 Essential (primary) hypertension: Secondary | ICD-10-CM

## 2024-04-12 DIAGNOSIS — F17200 Nicotine dependence, unspecified, uncomplicated: Secondary | ICD-10-CM

## 2024-04-12 DIAGNOSIS — G4700 Insomnia, unspecified: Secondary | ICD-10-CM

## 2024-04-12 DIAGNOSIS — Z23 Encounter for immunization: Secondary | ICD-10-CM | POA: Diagnosis not present

## 2024-04-12 LAB — COMPREHENSIVE METABOLIC PANEL WITH GFR
ALT: 24 U/L (ref 3–35)
AST: 18 U/L (ref 5–37)
Albumin: 4.2 g/dL (ref 3.5–5.2)
Alkaline Phosphatase: 87 U/L (ref 39–117)
BUN: 19 mg/dL (ref 6–23)
CO2: 29 meq/L (ref 19–32)
Calcium: 9.3 mg/dL (ref 8.4–10.5)
Chloride: 98 meq/L (ref 96–112)
Creatinine, Ser: 0.84 mg/dL (ref 0.40–1.20)
GFR: 73.65 mL/min
Glucose, Bld: 98 mg/dL (ref 70–99)
Potassium: 4.6 meq/L (ref 3.5–5.1)
Sodium: 133 meq/L — ABNORMAL LOW (ref 135–145)
Total Bilirubin: 0.5 mg/dL (ref 0.2–1.2)
Total Protein: 6.6 g/dL (ref 6.0–8.3)

## 2024-04-12 LAB — CBC WITH DIFFERENTIAL/PLATELET
Basophils Absolute: 0 K/uL (ref 0.0–0.1)
Basophils Relative: 0.8 % (ref 0.0–3.0)
Eosinophils Absolute: 0.3 K/uL (ref 0.0–0.7)
Eosinophils Relative: 5.1 % — ABNORMAL HIGH (ref 0.0–5.0)
HCT: 43.1 % (ref 36.0–46.0)
Hemoglobin: 14.9 g/dL (ref 12.0–15.0)
Lymphocytes Relative: 21.1 % (ref 12.0–46.0)
Lymphs Abs: 1.3 K/uL (ref 0.7–4.0)
MCHC: 34.6 g/dL (ref 30.0–36.0)
MCV: 98.4 fl (ref 78.0–100.0)
Monocytes Absolute: 0.5 K/uL (ref 0.1–1.0)
Monocytes Relative: 8.9 % (ref 3.0–12.0)
Neutro Abs: 3.8 K/uL (ref 1.4–7.7)
Neutrophils Relative %: 64.1 % (ref 43.0–77.0)
Platelets: 363 K/uL (ref 150.0–400.0)
RBC: 4.38 Mil/uL (ref 3.87–5.11)
RDW: 12.8 % (ref 11.5–15.5)
WBC: 6 K/uL (ref 4.0–10.5)

## 2024-04-12 LAB — HEMOGLOBIN A1C: Hgb A1c MFr Bld: 5.2 % (ref 4.6–6.5)

## 2024-04-12 LAB — LIPID PANEL
Cholesterol: 185 mg/dL (ref 28–200)
HDL: 69.6 mg/dL
LDL Cholesterol: 97 mg/dL (ref 10–99)
NonHDL: 115.36
Total CHOL/HDL Ratio: 3
Triglycerides: 94 mg/dL (ref 10.0–149.0)
VLDL: 18.8 mg/dL (ref 0.0–40.0)

## 2024-04-12 LAB — TSH: TSH: 1.33 u[IU]/mL (ref 0.35–5.50)

## 2024-04-12 MED ORDER — ZOLPIDEM TARTRATE 10 MG PO TABS: 10.0000 mg | ORAL_TABLET | Freq: Every day | ORAL | 5 refills | Status: AC

## 2024-04-12 MED ORDER — METOPROLOL SUCCINATE ER 25 MG PO TB24: 25.0000 mg | ORAL_TABLET | Freq: Every day | ORAL | 1 refills | Status: AC

## 2024-04-12 MED ORDER — AMLODIPINE BESYLATE 10 MG PO TABS: 10.0000 mg | ORAL_TABLET | Freq: Every day | ORAL | 1 refills | Status: AC

## 2024-04-12 MED ORDER — DESONIDE 0.05 % EX CREA: TOPICAL_CREAM | Freq: Two times a day (BID) | CUTANEOUS | 0 refills | Status: AC | PRN

## 2024-04-12 MED ORDER — SERTRALINE HCL 100 MG PO TABS: 100.0000 mg | ORAL_TABLET | Freq: Every day | ORAL | 1 refills | Status: AC

## 2024-04-12 MED ORDER — BUPROPION HCL ER (SR) 150 MG PO TB12: ORAL_TABLET | ORAL | 3 refills | Status: AC

## 2024-04-12 MED ORDER — VALSARTAN-HYDROCHLOROTHIAZIDE 320-25 MG PO TABS: 1.0000 | ORAL_TABLET | Freq: Every day | ORAL | 1 refills | Status: AC

## 2024-04-12 NOTE — Progress Notes (Signed)
 "  Complete physical exam  Patient: Alexandria Cruz   DOB: Dec 24, 1960   64 y.o. Female  MRN: [id number]  Subjective:    Chief Complaint  Patient presents with   Medical Management of Chronic Issues    Alexandria Cruz is a 64 y.o. female who presents today for a complete physical exam. She reports consuming a general diet. Eating daily fruits and veggies as well as protein, no vitamins or supplements. Cooks her own food, eats chips occasionally, only eats out about once a week. The patient does not participate in regular exercise at present. She generally feels well. She reports sleeping fairly well. She does not have additional problems to discuss today.   Patient has failed belsomra , doxepin , pt reports she has also tried trazodone  in the past which also hasn't worked.   Most recent fall risk assessment:    11/04/2022   12:05 PM  Fall Risk   Falls in the past year? 0  Number falls in past yr: 0  Injury with Fall? 0   Risk for fall due to : No Fall Risks  Follow up Falls evaluation completed     Data saved with a previous flowsheet row definition     Most recent depression screenings:    09/20/2023    7:57 AM 03/21/2023   10:40 AM  PHQ 2/9 Scores  PHQ - 2 Score 0 0  PHQ- 9 Score  3      Data saved with a previous flowsheet row definition    Vision:Within last year and Dental: No current dental problems and Receives regular dental care  Patient Active Problem List   Diagnosis Date Noted   Depression, major, single episode, moderate (HCC) 09/02/2022   Healthy adult on routine physical examination 03/04/2022   Cerumen impaction 03/04/2022   Screening for cervical cancer 06/28/2017   Depression, major, recurrent, in partial remission 12/14/2016   Insomnia 04/15/2014   Hyperlipidemia, mixed 02/17/2011   Menopausal symptoms 02/04/2011   Tobacco dependence 02/24/2010   Essential hypertension 02/24/2010   Allergies 02/24/2010      Patient Care Team: Ozell Heron HERO, MD as PCP - General (Family Medicine)   Show/hide medication list[1]  Review of Systems  HENT:  Negative for hearing loss.   Eyes:  Negative for blurred vision.  Respiratory:  Negative for shortness of breath.   Cardiovascular:  Negative for chest pain.  Gastrointestinal: Negative.   Genitourinary: Negative.   Musculoskeletal:  Negative for back pain.  Neurological:  Negative for headaches.  Psychiatric/Behavioral:  Negative for depression.        Objective:     BP 138/68   Pulse 62   Temp 97.8 F (36.6 C) (Oral)   Ht 5' 7 (1.702 m)   Wt 219 lb 3.2 oz (99.4 kg)   LMP 10/28/2010   SpO2 98%   BMI 34.33 kg/m    Physical Exam Vitals reviewed.  Constitutional:      Appearance: Normal appearance. She is well-groomed. She is obese.  HENT:     Right Ear: Tympanic membrane and ear canal normal.     Left Ear: Tympanic membrane and ear canal normal.     Mouth/Throat:     Mouth: Mucous membranes are moist.     Pharynx: No posterior oropharyngeal erythema.  Eyes:     Conjunctiva/sclera: Conjunctivae normal.  Neck:     Thyroid : No thyromegaly.  Cardiovascular:     Rate and Rhythm: Normal rate and regular rhythm.  Pulses: Normal pulses.     Heart sounds: S1 normal and S2 normal.  Pulmonary:     Effort: Pulmonary effort is normal.     Breath sounds: Normal breath sounds and air entry.  Abdominal:     General: Abdomen is flat. Bowel sounds are normal.     Palpations: Abdomen is soft.  Musculoskeletal:     Right lower leg: No edema.     Left lower leg: No edema.  Lymphadenopathy:     Cervical: No cervical adenopathy.  Neurological:     Mental Status: She is alert and oriented to person, place, and time. Mental status is at baseline.     Gait: Gait is intact.  Psychiatric:        Mood and Affect: Mood and affect normal.        Speech: Speech normal.        Behavior: Behavior normal.        Judgment: Judgment normal.      No results found for any  visits on 04/12/24.     Assessment & Plan:    Routine Health Maintenance and Physical Exam  Immunization History  Administered Date(s) Administered   Influenza Whole 01/03/2010   Influenza, Seasonal, Injecte, Preservative Fre 03/21/2023, 04/12/2024   Influenza,inj,Quad PF,6+ Mos 12/14/2016, 02/01/2018, 12/03/2021   Moderna Sars-Covid-2 Vaccination 08/04/2019, 10/04/2019   Td 11/03/2009   Tdap 05/02/2012, 03/21/2023   Zoster Recombinant(Shingrix ) 12/03/2021, 03/04/2022    Health Maintenance  Topic Date Due   Hepatitis C Screening  Never done   Pneumococcal Vaccine: 50+ Years (1 of 2 - PCV) Never done   COVID-19 Vaccine (3 - 2025-26 season) 12/05/2023   HIV Screening  04/12/2025 (Originally 04/09/1975)   Mammogram  09/11/2025   Fecal DNA (Cologuard)  02/03/2026   Cervical Cancer Screening (HPV/Pap Cotest)  03/05/2027   DTaP/Tdap/Td (4 - Td or Tdap) 03/20/2033   Influenza Vaccine  Completed   Zoster Vaccines- Shingrix   Completed   Hepatitis B Vaccines 19-59 Average Risk  Aged Out   HPV VACCINES  Aged Out   Meningococcal B Vaccine  Aged Out   Colonoscopy  Discontinued    Discussed health benefits of physical activity, and encouraged her to engage in regular exercise appropriate for her age and condition.  Immunization due -     Flu vaccine trivalent PF, 6mos and older(Flulaval,Afluria,Fluarix,Fluzone)  Essential hypertension -     amLODIPine  Besylate; Take 1 tablet (10 mg total) by mouth at bedtime.  Dispense: 90 tablet; Refill: 1 -     Metoprolol  Succinate ER; Take 1 tablet (25 mg total) by mouth at bedtime.  Dispense: 90 tablet; Refill: 1 -     Valsartan -hydroCHLOROthiazide ; Take 1 tablet by mouth daily.  Dispense: 90 tablet; Refill: 1 -     Comprehensive metabolic panel with GFR; Future  Tobacco dependence -     buPROPion  HCl ER (SR); TAKE ONE TABLET BY MOUTH ONCE DAILY FOR SEVEN DAYS THEN INCREASE TO ONE TABLET TWICE DAILY  Dispense: 180 tablet; Refill:  3  Dermatitis -     Desonide ; Apply topically 2 (two) times daily as needed.  Dispense: 30 g; Refill: 0  Insomnia, unspecified type -     Zolpidem  Tartrate; Take 1 tablet (10 mg total) by mouth at bedtime.  Dispense: 30 tablet; Refill: 5  Depression, major, recurrent, in partial remission -     Sertraline  HCl; Take 1 tablet (100 mg total) by mouth daily.  Dispense: 90 tablet; Refill: 1 -  CBC with Differential/Platelet; Future -     TSH; Future  Need for hepatitis C screening test -     Hepatitis C antibody; Future  Hyperlipidemia, mixed -     Lipid panel; Future  Diabetes mellitus screening -     Hemoglobin A1c; Future  General physical exam findings are normal today. I reviewed the patient's preventative testing, immunizations, and lifestyle habits. I made appropriate recommendations and placed orders for the appropriate tests and/or vaccinations. I counseled the patient on the CDC's recommendations for healthy exercise and diet. I counseled the patient on healthy sleep habits and stress management. Handouts to reinforce the counseling were given at the conclusion of the visit.    Return in about 6 months (around 10/10/2024) for HTN.     Heron CHRISTELLA Sharper, MD     [1]  Outpatient Medications Prior to Visit  Medication Sig   [DISCONTINUED] amLODipine  (NORVASC ) 10 MG tablet Take 1 tablet (10 mg total) by mouth at bedtime.   [DISCONTINUED] buPROPion  (WELLBUTRIN  SR) 150 MG 12 hr tablet TAKE ONE TABLET BY MOUTH ONCE DAILY FOR SEVEN DAYS THEN INCREASE TO ONE TABLET TWICE DAILY   [DISCONTINUED] desonide  (DESOWEN ) 0.05 % cream Apply topically 2 (two) times daily as needed.   [DISCONTINUED] Doxepin  HCl 3 MG TABS Take 1-2 tablets (3-6 mg total) by mouth at bedtime.   [DISCONTINUED] metoprolol  succinate (TOPROL -XL) 25 MG 24 hr tablet Take 1 tablet (25 mg total) by mouth at bedtime.   [DISCONTINUED] sertraline  (ZOLOFT ) 100 MG tablet Take 1 tablet (100 mg total) by mouth daily.    [DISCONTINUED] valsartan -hydrochlorothiazide  (DIOVAN -HCT) 320-25 MG tablet Take 1 tablet by mouth daily.   [DISCONTINUED] zolpidem  (AMBIEN ) 10 MG tablet Take 1 tablet (10 mg total) by mouth at bedtime.   No facility-administered medications prior to visit.   "

## 2024-04-13 ENCOUNTER — Ambulatory Visit: Payer: Self-pay | Admitting: Family Medicine

## 2024-04-13 LAB — HEPATITIS C ANTIBODY: Hepatitis C Ab: NONREACTIVE
